# Patient Record
Sex: Female | Born: 1960 | Race: Black or African American | Hispanic: No | Marital: Single | Smoking: Current every day smoker
Health system: Southern US, Community
[De-identification: ages and names within clinical notes are randomized; demographics above are authoritative.]

## PROBLEM LIST (undated history)

## (undated) DIAGNOSIS — M1990 Unspecified osteoarthritis, unspecified site: Secondary | ICD-10-CM

## (undated) DIAGNOSIS — M359 Systemic involvement of connective tissue, unspecified: Secondary | ICD-10-CM

## (undated) DIAGNOSIS — E8809 Other disorders of plasma-protein metabolism, not elsewhere classified: Secondary | ICD-10-CM

## (undated) DIAGNOSIS — E538 Deficiency of other specified B group vitamins: Secondary | ICD-10-CM

## (undated) DIAGNOSIS — M549 Dorsalgia, unspecified: Secondary | ICD-10-CM

## (undated) DIAGNOSIS — M797 Fibromyalgia: Secondary | ICD-10-CM

## (undated) DIAGNOSIS — M069 Rheumatoid arthritis, unspecified: Secondary | ICD-10-CM

## (undated) HISTORY — PX: ABDOMINAL HYSTERECTOMY: SHX81

---

## 2000-05-13 ENCOUNTER — Emergency Department (HOSPITAL_COMMUNITY): Admission: EM | Admit: 2000-05-13 | Discharge: 2000-05-13 | Payer: Self-pay | Admitting: Emergency Medicine

## 2000-05-14 ENCOUNTER — Emergency Department (HOSPITAL_COMMUNITY): Admission: EM | Admit: 2000-05-14 | Discharge: 2000-05-14 | Payer: Self-pay | Admitting: Emergency Medicine

## 2001-03-22 ENCOUNTER — Encounter: Payer: Self-pay | Admitting: Family Medicine

## 2001-03-22 ENCOUNTER — Ambulatory Visit (HOSPITAL_COMMUNITY): Admission: RE | Admit: 2001-03-22 | Discharge: 2001-03-22 | Payer: Self-pay | Admitting: Family Medicine

## 2001-12-06 ENCOUNTER — Encounter: Payer: Self-pay | Admitting: Internal Medicine

## 2001-12-06 ENCOUNTER — Ambulatory Visit (HOSPITAL_COMMUNITY): Admission: RE | Admit: 2001-12-06 | Discharge: 2001-12-06 | Payer: Self-pay | Admitting: Internal Medicine

## 2004-03-15 ENCOUNTER — Ambulatory Visit (HOSPITAL_COMMUNITY): Admission: RE | Admit: 2004-03-15 | Discharge: 2004-03-15 | Payer: Self-pay | Admitting: Family Medicine

## 2010-07-30 ENCOUNTER — Encounter: Payer: Self-pay | Admitting: Family Medicine

## 2011-09-21 ENCOUNTER — Encounter (HOSPITAL_COMMUNITY): Payer: Self-pay

## 2011-09-21 ENCOUNTER — Inpatient Hospital Stay (HOSPITAL_COMMUNITY)
Admission: EM | Admit: 2011-09-21 | Discharge: 2011-09-24 | DRG: 916 | Disposition: A | Discharge status: Home / Self Care | Payer: Medicare Other | Attending: Internal Medicine | Admitting: Internal Medicine

## 2011-09-21 DIAGNOSIS — E162 Hypoglycemia, unspecified: Secondary | ICD-10-CM | POA: Diagnosis present

## 2011-09-21 DIAGNOSIS — M549 Dorsalgia, unspecified: Secondary | ICD-10-CM | POA: Diagnosis present

## 2011-09-21 DIAGNOSIS — X58XXXA Exposure to other specified factors, initial encounter: Secondary | ICD-10-CM | POA: Diagnosis present

## 2011-09-21 DIAGNOSIS — D649 Anemia, unspecified: Secondary | ICD-10-CM | POA: Diagnosis present

## 2011-09-21 DIAGNOSIS — M545 Low back pain, unspecified: Secondary | ICD-10-CM | POA: Diagnosis present

## 2011-09-21 DIAGNOSIS — E8809 Other disorders of plasma-protein metabolism, not elsewhere classified: Secondary | ICD-10-CM | POA: Diagnosis present

## 2011-09-21 DIAGNOSIS — D539 Nutritional anemia, unspecified: Secondary | ICD-10-CM | POA: Diagnosis present

## 2011-09-21 DIAGNOSIS — T7840XA Allergy, unspecified, initial encounter: Secondary | ICD-10-CM | POA: Diagnosis present

## 2011-09-21 DIAGNOSIS — G8929 Other chronic pain: Secondary | ICD-10-CM | POA: Diagnosis present

## 2011-09-21 DIAGNOSIS — F17200 Nicotine dependence, unspecified, uncomplicated: Secondary | ICD-10-CM | POA: Diagnosis present

## 2011-09-21 DIAGNOSIS — T783XXA Angioneurotic edema, initial encounter: Principal | ICD-10-CM | POA: Diagnosis present

## 2011-09-21 DIAGNOSIS — M810 Age-related osteoporosis without current pathological fracture: Secondary | ICD-10-CM | POA: Diagnosis present

## 2011-09-21 DIAGNOSIS — M069 Rheumatoid arthritis, unspecified: Secondary | ICD-10-CM | POA: Diagnosis present

## 2011-09-21 DIAGNOSIS — E876 Hypokalemia: Secondary | ICD-10-CM | POA: Diagnosis present

## 2011-09-21 DIAGNOSIS — E538 Deficiency of other specified B group vitamins: Secondary | ICD-10-CM | POA: Diagnosis present

## 2011-09-21 HISTORY — DX: Unspecified osteoarthritis, unspecified site: M19.90

## 2011-09-21 HISTORY — DX: Dorsalgia, unspecified: M54.9

## 2011-09-21 HISTORY — DX: Rheumatoid arthritis, unspecified: M06.9

## 2011-09-21 HISTORY — DX: Deficiency of other specified B group vitamins: E53.8

## 2011-09-21 HISTORY — DX: Other disorders of plasma-protein metabolism, not elsewhere classified: E88.09

## 2011-09-21 HISTORY — DX: Fibromyalgia: M79.7

## 2011-09-21 LAB — POCT I-STAT, CHEM 8
Calcium, Ion: 1.16 mmol/L (ref 1.12–1.32)
Creatinine, Ser: 0.8 mg/dL (ref 0.50–1.10)
Sodium: 145 mEq/L (ref 135–145)

## 2011-09-21 MED ORDER — ACETAMINOPHEN 500 MG PO TABS
1000.0000 mg | ORAL_TABLET | Freq: Once | ORAL | Status: AC
  Administered 2011-09-21: 1000 mg via ORAL
  Filled 2011-09-21: qty 2

## 2011-09-21 MED ORDER — POTASSIUM CHLORIDE CRYS ER 20 MEQ PO TBCR
60.0000 meq | EXTENDED_RELEASE_TABLET | Freq: Once | ORAL | Status: AC
  Administered 2011-09-22: 60 meq via ORAL
  Filled 2011-09-21: qty 3

## 2011-09-21 MED ORDER — ONDANSETRON HCL 4 MG/2ML IJ SOLN
4.0000 mg | INTRAMUSCULAR | Status: AC | PRN
  Administered 2011-09-21: 4 mg via INTRAVENOUS

## 2011-09-21 MED ORDER — DIPHENHYDRAMINE HCL 50 MG/ML IJ SOLN
50.0000 mg | Freq: Once | INTRAMUSCULAR | Status: AC
  Administered 2011-09-21: 50 mg via INTRAVENOUS
  Filled 2011-09-21: qty 1

## 2011-09-21 MED ORDER — ONDANSETRON HCL 4 MG/2ML IJ SOLN
INTRAMUSCULAR | Status: AC
  Filled 2011-09-21: qty 2

## 2011-09-21 MED ORDER — PREDNISONE 20 MG PO TABS
60.0000 mg | ORAL_TABLET | Freq: Once | ORAL | Status: AC
  Administered 2011-09-21: 60 mg via ORAL
  Filled 2011-09-21: qty 3

## 2011-09-21 NOTE — ED Provider Notes (Signed)
History     CSN: [id number]  Arrival date & time 09/21/11  1744   First MD Initiated Contact with Patient 09/21/11 1903      Chief Complaint  Patient presents with  . Allergic Reaction    HPI Pt was seen at 1900.  Per pt and family, c/o gradual onset and persistence of waxing and waning diffuse hives and "swelling all over" since last week.  Pt states her symptoms began shortly after she received an IM injection of solumedrol when she was at a local ED for c/o acute flair of her chronic back pain.  Pt has been eval by 3 different ED's since last week for the same symptoms since then.  States she has been treated with prednisone, atarax, benadryl, zantac with intermittent improvement "but then everything just comes back again."  Pt states she came to this ED because "it's still happening" and she "wants it fixed."  Denies dysphagia, no hoarse voice, no drooling/stridor, no wheezing/SOB, no CP, no abd pain, no N/V/D.       Past Medical History  Diagnosis Date  . Back pain   . Fibromyalgia   . Osteoporosis   . Rheumatoid arthritis   . DJD (degenerative joint disease)     Past Surgical History  Procedure Date  . Abdominal hysterectomy     History  Substance Use Topics  . Smoking status: Current Everyday Smoker    Types: Cigarettes  . Smokeless tobacco: Not on file  . Alcohol Use: No    Review of Systems ROS: Statement: All systems negative except as marked or noted in the HPI; Constitutional: Negative for fever and chills. ; ; Eyes: Negative for eye pain, redness and discharge. ; ; ENMT: Negative for ear pain, hoarseness, nasal congestion, sinus pressure and sore throat. ; ; Cardiovascular: Negative for chest pain, palpitations, diaphoresis, dyspnea and peripheral edema. ; ; Respiratory: Negative for cough, wheezing and stridor. ; ; Gastrointestinal: Negative for nausea, vomiting, diarrhea, abdominal pain, blood in stool, hematemesis, jaundice and rectal bleeding. . ; ;  Genitourinary: Negative for dysuria, flank pain and hematuria. ; ; Musculoskeletal: Negative for back pain and neck pain. Negative for swelling and trauma.; ; Skin: +hives.  Negative for abrasions, blisters, bruising and skin lesion.; ; Neuro: Negative for headache, lightheadedness and neck stiffness. Negative for weakness, altered level of consciousness , altered mental status, extremity weakness, paresthesias, involuntary movement, seizure and syncope.    Allergies  Review of patient's allergies indicates not on file.  Home Medications  No current outpatient prescriptions on file.  BP 124/82  Pulse 110  Temp(Src) 99 F (37.2 C) (Oral)  Resp 18  Ht 5\' 5"  (1.651 m)  Wt 150 lb (68.04 kg)  BMI 24.96 kg/m2  SpO2 99%  Physical Exam 1905: Physical examination:  Nursing notes reviewed; Vital signs and O2 SAT reviewed;  Constitutional: Well developed, Well nourished, Well hydrated, In no acute distress; Head:  Normocephalic, atraumatic; Eyes: EOMI, PERRL, No scleral icterus; ENMT: Mouth and pharynx normal, Mucous membranes moist, no intra-oral edema or erythema, no hoarse voice, no drooling, no stridor; Neck: Supple, Full range of motion, No lymphadenopathy; Cardiovascular: Regular rate and rhythm, No murmur, rub, or gallop; Respiratory: Breath sounds clear & equal bilaterally, No rales, rhonchi, wheezes, Normal respiratory effort/excursion, speaking full sentences with ease.; Chest: Nontender, Movement normal; Abdomen: Soft, Nontender, Nondistended, Normal bowel sounds; Extremities: Pulses normal, No tenderness, No edema, No calf edema or asymmetry.; Neuro: AA&Ox3, Major CN grossly intact. Speech clear.  No gross focal motor or sensory deficits in extremities.; Skin: Color normal, Warm, Dry, +diffuse hives to neck, upper back/chest and arms.  +bilateral periorbital and left lower face edema.      ED Course  Procedures   1905:  Pt is yelling in a loud clear voice to ED staff saying she "wants it  completely better right now!"  Long d/w pt and family regarding allergic reaction.  Family verb understanding, pt continues to state she "doesn't care and wants it fixed now!"    9:13 PM:  Pt states she "doesn't feel any better" after prednisone and benadryl.  States her "throat feels like it's closing now."  No intra-oral edema, no hoarse voice, no drooling or stridor.  Speech clear, lungs continue CTA bilat with Sats 97% R/A.  Dx testing d/w pt and family.  Questions answered.  Verb understanding, agreeable to possible observation admit.  T/C to [facility] Dr. Rito Ehrlich, case discussed, including:  HPI, pertinent PM/SHx, VS/PE, dx testing, ED course and treatment:  Agreeable to come to ED to eval pt and speak with pt/family regarding possible observation admit.    MDM  MDM Reviewed: nursing note and vitals   Results for orders placed during the hospital encounter of 09/21/11  POCT I-STAT, CHEM 8      Component Value Range   Sodium 145  135 - 145 (mEq/L)   Potassium 3.1 (*) 3.5 - 5.1 (mEq/L)   Chloride 106  96 - 112 (mEq/L)   BUN 17  6 - 23 (mg/dL)   Creatinine, Ser 1.61  0.50 - 1.10 (mg/dL)   Glucose, Bld 69 (*) 70 - 99 (mg/dL)   Calcium, Ion 0.96  0.45 - 1.32 (mmol/L)   TCO2 28  0 - 100 (mmol/L)   Hemoglobin 11.2 (*) 12.0 - 15.0 (g/dL)   HCT 40.9 (*) 81.1 - 46.0 (%)              Laray Anger, DO 09/23/11 1445

## 2011-09-21 NOTE — ED Notes (Signed)
Went to another local er 2 weeks ago for back pain, was given solumedrol, since then face cont.to swell.  Was seen at a different er today and given several meds, swelling went down, now swollen again.  Unsure of cause, no difficulty breathing. Face and eyes swollen

## 2011-09-21 NOTE — ED Notes (Signed)
Report received from Judy, RN.

## 2011-09-21 NOTE — ED Notes (Signed)
Hospitalist assessing patient now.

## 2011-09-21 NOTE — ED Notes (Signed)
Patient started vomiting shortly after reassessing her. Patient states that she feels like her throat is closing up and is swelling more and she is having more difficulty breathing. Notified Dr. Richrd Prime. Patient complaining that a doctor needs to look at her throat.

## 2011-09-21 NOTE — H&P (Signed)
Melissa Berg is an 51 y.o. female.    PCP: Does not have one  Chief Complaint: Swelling up of throat and skin rash  HPI: This is a 51 year old, African American female, who is a history of chronic back pain, osteoporosis, Rheumatoid arthritis. However, she does not take any medications on a regular basis. Patient was in her usual state of health till March 4, when she went to [facility] for back pain. According to the patient and her daughter Solu-Medrol was injected into her back or the hip. About 10-15 minutes after she got this injection patient's tongue started swelling up and her throat started swelling up. She was taken back to the hospital, where she was given Benadryl and ranitidine and she improved. However, her hives on the skin did not improve. The hives were present all over the body. She subsequently went to [facility] and she was prescribed prednisone and Benadryl and was discharged home with the prednisone. The patient's symptoms did improve. She was prescribed prednisone for about 5-10 days. However, subsequently, the swelling returned after the prednisone was stopped. She went to [facility] in [place] today and was prescribed Vistaril, ranitidine, and Benadryl and was sent home. The rash and swelling got worse, and, so, she decided to come here. She's been given prednisone in the ED, and she's feeling somewhat better. However, her rash and her swelling still persist. She does have severe itching. She denies taking any other medications at this time. She denies using any new products on her body recently. Denies eating any new foods recently. She's blaming the Solu-Medrol for her allergic reaction.   Home Medications: Prior to Admission medications   Medication Sig Start Date End Date Taking? Authorizing Provider  ranitidine (ZANTAC) 150 MG tablet Take 150 mg by mouth 2 (two) times daily.   Yes Historical Provider, MD  predniSONE (DELTASONE) 20 MG tablet  Take 60 mg by mouth daily. For 5 days 09/15/11   Historical Provider, MD    Allergies:  Allergies  Allergen Reactions  . Other Hives, Itching and Swelling    Solumedrol REACTION: Severe facial, tongue, and throat swelling    Past Medical History: Past Medical History  Diagnosis Date  . Back pain   . Fibromyalgia   . Osteoporosis   . Rheumatoid arthritis   . DJD (degenerative joint disease)     Past Surgical History  Procedure Date  . Abdominal hysterectomy     Social History:  reports that she has been smoking Cigarettes.  She has been smoking about .25 packs per day. She does not have any smokeless tobacco history on file. She reports that she does not drink alcohol or use illicit drugs.  Family History: Diabetes, and hypertension runs in the family.  Review of Systems - History obtained from the patient General ROS: negative Psychological ROS: negative Ophthalmic ROS: negative ENT ROS: negative Allergy and Immunology ROS: positive for - hives Hematological and Lymphatic ROS: negative Endocrine ROS: negative Respiratory ROS: no cough, shortness of breath, or wheezing Cardiovascular ROS: no chest pain or dyspnea on exertion Gastrointestinal ROS: no abdominal pain, change in bowel habits, or black or bloody stools Genito-Urinary ROS: no dysuria, trouble voiding, or hematuria Musculoskeletal ROS: negative Neurological ROS: negative Dermatological ROS: negative  Physical Examination Blood pressure 114/98, pulse 106, temperature 98.8 F (37.1 C), temperature source Oral, resp. rate 18, height 5\' 5"  (1.651 m), weight 68.04 kg (150 lb), SpO2 95.00%.  General appearance: alert, cooperative, appears stated  age and no distress Head: Her face is swollen. Head is otherwise, normocephalic and atraumatic. Eyes: conjunctivae/corneas clear. PERRL, EOM's intact.  Throat: Her lips are slightly swollen. Tongue appears to be normal size. No stridor is present. Neck: no adenopathy, no  carotid bruit, no JVD, supple, symmetrical, trachea midline and thyroid not enlarged, symmetric, no tenderness/mass/nodules Back: symmetric, no curvature. ROM normal. No CVA tenderness. Resp: clear to auscultation bilaterally. No wheezing is heard. Cardio: regular rate and rhythm, S1, S2 normal, no murmur, click, rub or gallop GI: soft, non-tender; bowel sounds normal; no masses,  no organomegaly Extremities: extremities normal, atraumatic, no cyanosis or edema Pulses: 2+ and symmetric Skin: She urticaria all over her body, more pronounced in the neck area and the upper back. She has urticaria in the legs as well. Lymph nodes: Cervical, supraclavicular, and axillary nodes normal. Neurologic: Grossly normal  Laboratory Data: Results for orders placed during the hospital encounter of 09/21/11 (from the past 48 hour(s))  POCT I-STAT, CHEM 8     Status: Abnormal   Collection Time   09/21/11  8:50 PM      Component Value Range Comment   Sodium 145  135 - 145 (mEq/L)    Potassium 3.1 (*) 3.5 - 5.1 (mEq/L)    Chloride 106  96 - 112 (mEq/L)    BUN 17  6 - 23 (mg/dL)    Creatinine, Ser 1.61  0.50 - 1.10 (mg/dL)    Glucose, Bld 69 (*) 70 - 99 (mg/dL)    Calcium, Ion 0.96  1.12 - 1.32 (mmol/L)    TCO2 28  0 - 100 (mmol/L)    Hemoglobin 11.2 (*) 12.0 - 15.0 (g/dL)    HCT 04.5 (*) 40.9 - 46.0 (%)     Radiology Reports: No results found.  Assessment/Plan  Principal Problem:  *Allergic angioedema Active Problems:  Allergic reaction  Chronic back pain  Hypokalemia  Hypoglycemia  Anemia   #1 allergic angioedema with the allergic reaction: Etiology for this remains unclear. The patient attributes this to Solu-Medrol. However, she did show improvement, when she was put on prednisone while she was at [facility]. Because patient still has significant swelling of her face and has severe skin reaction we will observe the patient in step down unit. We'll give her prednisone, Benadryl, H2 antagonist.  Since the inciting agent is not known patient may have to be referred to an allergist as an outpatient. I am hoping with the above-mentioned treatment her symptoms will improve.  #2 hypokalemia. Will be repleted orally and intravenously. Magnesium will be checked.  #3 hypoglycemia: We will recheck her glucose levels.  #4 anemia: check anemia panel.  She has chronic back pain, osteoporosis, rheumatoid arthritis. All these issues appear to be stable.  DVT, prophylaxis with SCDs.  Further management decisions will depend on results of further testing and patient's response to treatment  [facility] Pager [phone]  09/22/2011, 12:00 AM

## 2011-09-21 NOTE — ED Notes (Signed)
Patient ambulated to bathroom.

## 2011-09-21 NOTE — ED Notes (Signed)
Assessed patient's throat, no visible airway swelling/airway obstruction noted. Throat appears swollen, reddened.

## 2011-09-22 ENCOUNTER — Encounter (HOSPITAL_COMMUNITY): Payer: Self-pay | Admitting: Intensive Care

## 2011-09-22 LAB — GLUCOSE, CAPILLARY: Glucose-Capillary: 135 mg/dL — ABNORMAL HIGH (ref 70–99)

## 2011-09-22 LAB — COMPREHENSIVE METABOLIC PANEL
Albumin: 2.8 g/dL — ABNORMAL LOW (ref 3.5–5.2)
BUN: 10 mg/dL (ref 6–23)
Calcium: 8.7 mg/dL (ref 8.4–10.5)
Chloride: 104 mEq/L (ref 96–112)
Creatinine, Ser: 0.67 mg/dL (ref 0.50–1.10)
Total Bilirubin: 0.9 mg/dL (ref 0.3–1.2)
Total Protein: 5.5 g/dL — ABNORMAL LOW (ref 6.0–8.3)

## 2011-09-22 LAB — URINALYSIS, ROUTINE W REFLEX MICROSCOPIC
Bilirubin Urine: NEGATIVE
Ketones, ur: NEGATIVE mg/dL
Leukocytes, UA: NEGATIVE
Nitrite: NEGATIVE
Protein, ur: NEGATIVE mg/dL
Urobilinogen, UA: 0.2 mg/dL (ref 0.0–1.0)

## 2011-09-22 LAB — CBC
Hemoglobin: 12.3 g/dL (ref 12.0–15.0)
MCV: 95.1 fL (ref 78.0–100.0)
Platelets: 156 10*3/uL (ref 150–400)
RBC: 3.84 MIL/uL — ABNORMAL LOW (ref 3.87–5.11)
WBC: 14.9 10*3/uL — ABNORMAL HIGH (ref 4.0–10.5)

## 2011-09-22 LAB — IRON AND TIBC
Iron: 17 ug/dL — ABNORMAL LOW (ref 42–135)
Saturation Ratios: 6 % — ABNORMAL LOW (ref 20–55)
TIBC: 264 ug/dL (ref 250–470)

## 2011-09-22 LAB — FERRITIN: Ferritin: 147 ng/mL (ref 10–291)

## 2011-09-22 LAB — MRSA PCR SCREENING: MRSA by PCR: NEGATIVE

## 2011-09-22 LAB — MAGNESIUM: Magnesium: 2.1 mg/dL (ref 1.5–2.5)

## 2011-09-22 MED ORDER — DIPHENHYDRAMINE HCL 25 MG PO CAPS
50.0000 mg | ORAL_CAPSULE | Freq: Four times a day (QID) | ORAL | Status: DC
  Administered 2011-09-22 – 2011-09-24 (×8): 50 mg via ORAL
  Filled 2011-09-22 (×8): qty 2

## 2011-09-22 MED ORDER — ONDANSETRON HCL 4 MG/2ML IJ SOLN: 4.0000 mg | Freq: Four times a day (QID) | INTRAMUSCULAR | Status: DC | PRN

## 2011-09-22 MED ORDER — ALBUTEROL SULFATE (5 MG/ML) 0.5% IN NEBU
2.5000 mg | INHALATION_SOLUTION | RESPIRATORY_TRACT | Status: DC | PRN
  Administered 2011-09-22 – 2011-09-23 (×3): 2.5 mg via RESPIRATORY_TRACT
  Filled 2011-09-22 (×3): qty 0.5

## 2011-09-22 MED ORDER — DIPHENHYDRAMINE HCL 50 MG/ML IJ SOLN: 25.0000 mg | Freq: Four times a day (QID) | INTRAMUSCULAR | Status: DC | PRN

## 2011-09-22 MED ORDER — ACETAMINOPHEN 325 MG PO TABS
650.0000 mg | ORAL_TABLET | Freq: Four times a day (QID) | ORAL | Status: DC | PRN
  Administered 2011-09-22: 650 mg via ORAL
  Filled 2011-09-22: qty 2

## 2011-09-22 MED ORDER — DIPHENHYDRAMINE HCL 50 MG/ML IJ SOLN: 50.0000 mg | Freq: Once | INTRAMUSCULAR | Status: DC

## 2011-09-22 MED ORDER — DIPHENHYDRAMINE HCL 50 MG/ML IJ SOLN
INTRAMUSCULAR | Status: AC
  Administered 2011-09-22: 12:00:00
  Filled 2011-09-22: qty 1

## 2011-09-22 MED ORDER — PREDNISONE 20 MG PO TABS
60.0000 mg | ORAL_TABLET | Freq: Two times a day (BID) | ORAL | Status: DC
  Administered 2011-09-22: 60 mg via ORAL
  Filled 2011-09-22: qty 3

## 2011-09-22 MED ORDER — SODIUM CHLORIDE 0.9 % IJ SOLN
3.0000 mL | Freq: Two times a day (BID) | INTRAMUSCULAR | Status: DC
  Administered 2011-09-22 – 2011-09-23 (×4): 3 mL via INTRAVENOUS
  Filled 2011-09-22 (×3): qty 3

## 2011-09-22 MED ORDER — KCL IN DEXTROSE-NACL 20-5-0.45 MEQ/L-%-% IV SOLN
INTRAVENOUS | Status: DC
  Administered 2011-09-22: 100 mL via INTRAVENOUS

## 2011-09-22 MED ORDER — ACETAMINOPHEN 650 MG RE SUPP: 650.0000 mg | Freq: Four times a day (QID) | RECTAL | Status: DC | PRN

## 2011-09-22 MED ORDER — LORAZEPAM 0.5 MG PO TABS
0.5000 mg | ORAL_TABLET | Freq: Four times a day (QID) | ORAL | Status: DC | PRN
  Filled 2011-09-22: qty 1

## 2011-09-22 MED ORDER — PREDNISONE 20 MG PO TABS
60.0000 mg | ORAL_TABLET | Freq: Every day | ORAL | Status: DC
  Administered 2011-09-23 – 2011-09-24 (×2): 60 mg via ORAL
  Filled 2011-09-22 (×3): qty 3

## 2011-09-22 MED ORDER — FAMOTIDINE 20 MG PO TABS
20.0000 mg | ORAL_TABLET | Freq: Two times a day (BID) | ORAL | Status: DC
  Administered 2011-09-22 – 2011-09-24 (×6): 20 mg via ORAL
  Filled 2011-09-22 (×6): qty 1

## 2011-09-22 MED ORDER — ONDANSETRON HCL 4 MG PO TABS: 4.0000 mg | ORAL_TABLET | Freq: Four times a day (QID) | ORAL | Status: DC | PRN

## 2011-09-22 NOTE — Progress Notes (Signed)
Subjective: Itching and hives have returned. Irritated that her condition has "gone on for 13 days!" Can't sleep. Complains of sore throat and cough. Has seen a rheumatologist in [place] a few weeks ago.  Objective: Vital signs in last 24 hours: Filed Vitals:   09/22/11 0500 09/22/11 0600 09/22/11 0700 09/22/11 0947  BP: 107/66 91/66 102/53   Pulse: 68 71 78   Temp:   98.2 F (36.8 C)   TempSrc:   Oral   Resp: 16 15 28    Height:      Weight:      SpO2: 94% 94% 94% 98%   Weight change:   Intake/Output Summary (Last 24 hours) at 09/22/11 1036 Last data filed at 09/22/11 0800  Gross per 24 hour  Intake    360 ml  Output    825 ml  Net   -465 ml   Physical Exam: General: Yelling and agitated. No respiratory distress HEENT facial swelling, periorbital, lips. No airway compromise. Tongue is not swollen. Lungs clear to auscultation bilaterally without wheeze rhonchi or rales Cardiovascular regular rate rhythm without murmurs gallops rubs Abdomen soft nontender nondistended Extremities no clubbing cyanosis or edema skin diffuse urticaria  Lab Results: Basic Metabolic Panel:  Lab 09/22/11 2956 09/21/11 2050  NA 135 145  K 4.3 3.1*  CL 104 106  CO2 27 --  GLUCOSE 111* 69*  BUN 10 17  CREATININE 0.67 0.80  CALCIUM 8.7 --  MG 2.1 --  PHOS -- --   Liver Function Tests:  Lab 09/22/11 0511  AST 17  ALT 17  ALKPHOS 56  BILITOT 0.9  PROT 5.5*  ALBUMIN 2.8*   No results found for this basename: LIPASE:2,AMYLASE:2 in the last 168 hours No results found for this basename: AMMONIA:2 in the last 168 hours CBC:  Lab 09/22/11 0511 09/21/11 2050  WBC 14.9* --  NEUTROABS -- --  HGB 12.3 11.2*  HCT 36.5 33.0*  MCV 95.1 --  PLT 156 --   Cardiac Enzymes: No results found for this basename: CKTOTAL:3,CKMB:3,CKMBINDEX:3,TROPONINI:3 in the last 168 hours BNP: No results found for this basename: PROBNP:3 in the last 168 hours D-Dimer: No results found for this basename:  DDIMER:2 in the last 168 hours CBG:  Lab 09/22/11 0009  GLUCAP 135*   Hemoglobin A1C: No results found for this basename: HGBA1C in the last 168 hours Fasting Lipid Panel: No results found for this basename: CHOL,HDL,LDLCALC,TRIG,CHOLHDL,LDLDIRECT in the last 213 hours Thyroid Function Tests: No results found for this basename: TSH,T4TOTAL,FREET4,T3FREE,THYROIDAB in the last 168 hours Coagulation: No results found for this basename: LABPROT:4,INR:4 in the last 168 hours Anemia Panel:  Lab 09/22/11 0511  VITAMINB12 --  FOLATE --  FERRITIN --  TIBC --  IRON --  RETICCTPCT 3.8*   Urine Drug Screen: Drugs of Abuse  No results found for this basename: labopia, cocainscrnur, labbenz, amphetmu, thcu, labbarb    Alcohol Level: No results found for this basename: ETH:2 in the last 168 hours Urinalysis:  Lab 09/22/11 0114  COLORURINE YELLOW  LABSPEC 1.020  PHURINE 7.0  GLUCOSEU NEGATIVE  HGBUR NEGATIVE  BILIRUBINUR NEGATIVE  KETONESUR NEGATIVE  PROTEINUR NEGATIVE  UROBILINOGEN 0.2  NITRITE NEGATIVE  LEUKOCYTESUR NEGATIVE   Micro Results: Recent Results (from the past 240 hour(s))  MRSA PCR SCREENING     Status: Normal   Collection Time   09/22/11  1:45 AM      Component Value Range Status Comment   MRSA by PCR NEGATIVE  NEGATIVE  Final     Scheduled Meds:   . acetaminophen  1,000 mg Oral Once  . diphenhydrAMINE      . diphenhydrAMINE  50 mg Oral Q6H  . diphenhydrAMINE  50 mg Intravenous Once  . diphenhydrAMINE  50 mg Intravenous Once  . famotidine  20 mg Oral BID  . potassium chloride  60 mEq Oral Once  . predniSONE  60 mg Oral Once  . predniSONE  60 mg Oral Q breakfast  . sodium chloride  3 mL Intravenous Q12H  . DISCONTD: predniSONE  60 mg Oral BID WC   Continuous Infusions:   . dextrose 5 % and 0.45 % NaCl with KCl 20 mEq/L 100 mL/hr at 09/22/11 0600   PRN Meds:.acetaminophen, albuterol, ondansetron, ondansetron (ZOFRAN) IV, ondansetron, DISCONTD:  acetaminophen, DISCONTD: diphenhydrAMINE Assessment/Plan: Principal Problem:  *Allergic angioedema Active Problems:  Allergic reaction  Chronic back pain  Hypokalemia, resolved  Hypoglycemia, resolved  Anemia Continue steroids. Change Benadryl to every 6 scheduled. Transferred to the floor. No evidence of airway compromise. Outpatient referral to allergy and immunology for further testing. Benzodiazepines as needed for agitation.   LOS: 1 day   Melissa Berg L 09/22/2011, 10:36 AM

## 2011-09-22 NOTE — ED Notes (Signed)
Attempted to call report on patient to icu, greg stated that a nurse from the floor had to come down before they could take this patient.

## 2011-09-23 MED ORDER — HYDROXYZINE HCL 25 MG PO TABS
50.0000 mg | ORAL_TABLET | Freq: Once | ORAL | Status: AC
  Administered 2011-09-23: 50 mg via ORAL
  Filled 2011-09-23: qty 2

## 2011-09-23 NOTE — Progress Notes (Signed)
PERIORBITAL EDEMA IS GREATLY IMPROVED SINCE ADMISSION FROM ER.PT STATES THAT HIVES AND ITCHING ESCALATE SHORTLY AFTER TAKING HER AM PREDNISONE RELIEF FROM ITCHING WHEN WIPES WITH CHG CLOTHS.

## 2011-09-23 NOTE — Progress Notes (Signed)
Subjective: Slept on and off last night. Rash got better but is starting to come back this morning.  Objective: Vital signs in last 24 hours: Filed Vitals:   09/22/11 2100 09/22/11 2344 09/23/11 0100 09/23/11 0426  BP: 118/61  114/65 120/81  Pulse: 67  68 58  Temp: 98.1 F (36.7 C) 98.1 F (36.7 C)  98.1 F (36.7 C)  TempSrc: Oral Oral  Oral  Resp: 19  13 14   Height:      Weight:      SpO2: 96%  97% 97%   Weight change:   Intake/Output Summary (Last 24 hours) at 09/23/11 1022 Last data filed at 09/23/11 0900  Gross per 24 hour  Intake   1320 ml  Output    775 ml  Net    545 ml   Physical Exam: General: Calmer today, but still periodically agitated HEENT still some periorbital edema, but less markeded. Somewhat violaceous around her eyes. Lips less swollen. Lungs clear to auscultation bilaterally without wheeze rhonchi or rales Cardiovascular regular rate rhythm without murmurs gallops rubs Abdomen soft nontender nondistended Extremities no clubbing cyanosis or edema skin diffuse urticaria. No Gottron's papules noted Skin: Urticaria less marked, but still some erythema. Multiple excoriation marks.  Lab Results: Basic Metabolic Panel:  Lab 09/22/11 2956 09/21/11 2050  NA 135 145  K 4.3 3.1*  CL 104 106  CO2 27 --  GLUCOSE 111* 69*  BUN 10 17  CREATININE 0.67 0.80  CALCIUM 8.7 --  MG 2.1 --  PHOS -- --   Liver Function Tests:  Lab 09/22/11 0511  AST 17  ALT 17  ALKPHOS 56  BILITOT 0.9  PROT 5.5*  ALBUMIN 2.8*   No results found for this basename: LIPASE:2,AMYLASE:2 in the last 168 hours No results found for this basename: AMMONIA:2 in the last 168 hours CBC:  Lab 09/22/11 0511 09/21/11 2050  WBC 14.9* --  NEUTROABS -- --  HGB 12.3 11.2*  HCT 36.5 33.0*  MCV 95.1 --  PLT 156 --   Cardiac Enzymes: No results found for this basename: CKTOTAL:3,CKMB:3,CKMBINDEX:3,TROPONINI:3 in the last 168 hours BNP: No results found for this basename: PROBNP:3  in the last 168 hours D-Dimer: No results found for this basename: DDIMER:2 in the last 168 hours CBG:  Lab 09/22/11 0009  GLUCAP 135*   Hemoglobin A1C: No results found for this basename: HGBA1C in the last 168 hours Fasting Lipid Panel: No results found for this basename: CHOL,HDL,LDLCALC,TRIG,CHOLHDL,LDLDIRECT in the last 213 hours Thyroid Function Tests: No results found for this basename: TSH,T4TOTAL,FREET4,T3FREE,THYROIDAB in the last 168 hours Coagulation: No results found for this basename: LABPROT:4,INR:4 in the last 168 hours Anemia Panel:  Lab 09/22/11 0511  VITAMINB12 179*  FOLATE 9.2  FERRITIN 147  TIBC 264  IRON 17*  RETICCTPCT 3.8*   Urine Drug Screen: Drugs of Abuse  No results found for this basename: labopia,  cocainscrnur,  labbenz,  amphetmu,  thcu,  labbarb    Alcohol Level: No results found for this basename: ETH:2 in the last 168 hours Urinalysis:  Lab 09/22/11 0114  COLORURINE YELLOW  LABSPEC 1.020  PHURINE 7.0  GLUCOSEU NEGATIVE  HGBUR NEGATIVE  BILIRUBINUR NEGATIVE  KETONESUR NEGATIVE  PROTEINUR NEGATIVE  UROBILINOGEN 0.2  NITRITE NEGATIVE  LEUKOCYTESUR NEGATIVE   Micro Results: Recent Results (from the past 240 hour(s))  MRSA PCR SCREENING     Status: Normal   Collection Time   09/22/11  1:45 AM  Component Value Range Status Comment   MRSA by PCR NEGATIVE  NEGATIVE  Final     Scheduled Meds:    . diphenhydrAMINE      . diphenhydrAMINE  50 mg Oral Q6H  . diphenhydrAMINE  50 mg Intravenous Once  . famotidine  20 mg Oral BID  . hydrOXYzine  50 mg Oral Once  . predniSONE  60 mg Oral Q breakfast  . sodium chloride  3 mL Intravenous Q12H  . DISCONTD: predniSONE  60 mg Oral BID WC   Continuous Infusions:    . DISCONTD: dextrose 5 % and 0.45 % NaCl with KCl 20 mEq/L 100 mL/hr at 09/22/11 0600   PRN Meds:.acetaminophen, albuterol, LORazepam, ondansetron (ZOFRAN) IV, ondansetron, DISCONTD: acetaminophen, DISCONTD:  diphenhydrAMINE Assessment/Plan: Principal Problem:  *Allergic angioedema improved but continues. Will add Atarax as needed. Continue Benadryl, Pepcid and prednisone. Will need referral to allergist as an outpatient. Patient has a violaceous rash today, but no other signs or symptoms of dermatomyositis. May need to discuss with her rheumatologist in [place]. Could this be an atypical presentation of an autoimmune problem?  Chronic back pain  Hypokalemia, resolved  Hypoglycemia, resolved  Anemia    LOS: 2 days   Nettie Wyffels L 09/23/2011, 10:22 AM

## 2011-09-24 ENCOUNTER — Encounter (HOSPITAL_COMMUNITY): Payer: Self-pay | Admitting: Internal Medicine

## 2011-09-24 DIAGNOSIS — E538 Deficiency of other specified B group vitamins: Secondary | ICD-10-CM

## 2011-09-24 DIAGNOSIS — E8809 Other disorders of plasma-protein metabolism, not elsewhere classified: Secondary | ICD-10-CM

## 2011-09-24 HISTORY — DX: Deficiency of other specified B group vitamins: E53.8

## 2011-09-24 HISTORY — DX: Other disorders of plasma-protein metabolism, not elsewhere classified: E88.09

## 2011-09-24 MED ORDER — DIPHENHYDRAMINE HCL 50 MG PO CAPS: 50.0000 mg | ORAL_CAPSULE | Freq: Four times a day (QID) | ORAL | Status: DC

## 2011-09-24 MED ORDER — VITAMIN B12 100 MCG PO TABS: ORAL_TABLET | ORAL | Status: DC

## 2011-09-24 MED ORDER — FAMOTIDINE 20 MG PO TABS: 20.0000 mg | ORAL_TABLET | Freq: Two times a day (BID) | ORAL | Status: AC

## 2011-09-24 MED ORDER — CYANOCOBALAMIN 1000 MCG/ML IJ SOLN
1000.0000 ug | Freq: Every day | INTRAMUSCULAR | Status: DC
  Administered 2011-09-24: 1000 ug via INTRAMUSCULAR
  Filled 2011-09-24: qty 1

## 2011-09-24 MED ORDER — PREDNISONE 10 MG PO TABS: ORAL_TABLET | ORAL | Status: DC

## 2011-09-24 NOTE — Discharge Summary (Signed)
Physician Discharge Summary  Melissa Berg MRN: [id number] DOB/AGE: 01-04-1961 51 y.o.  PCP: [facility] [place]   Admit date: 09/21/2011 Discharge date: 09/24/2011  Discharge Diagnoses:  1. Allergic angioedema. Etiology not certain, but however patient believes the culprit was Solu-Medrol. 2. Newly diagnosed vitamin B 12 deficiency with macrocytic anemia. 3. Hypoglycemia, resolved. 4. Hypokalemia, resolved. 5. Hypoalbuminemia. 6. Chronic low back pain. 7. Rheumatoid arthritis.    Medication List  As of 09/24/2011  9:25 AM   STOP taking these medications         ranitidine 150 MG tablet         TAKE these medications         diphenhydrAMINE 50 MG capsule   Commonly known as: BENADRYL   Take 1 capsule (50 mg total) by mouth every 6 (six) hours. TAKE UNTIL YOUR SYMPTOMS COMPLETELY RESOLVE.      famotidine 20 MG tablet   Commonly known as: PEPCID   Take 1 tablet (20 mg total) by mouth 2 (two) times daily. TAKE UNTIL YOUR SYMPTOMS COMPLETELY RESOLVE.      predniSONE 10 MG tablet   Commonly known as: DELTASONE   TAKE 6 TABLETS DAILY FOR TWO DAYS; THEN 5 TABLETS DAILY FOR TWO DAYS; THEN 4 TABLETS THE NEXT DAY; THEN 3 TABLETS THE NEXT DAY; THEN 2 TABLETS THE DAY; THEN 1 TABLET THE NEXT DAY; THEN STOP.      vitamin B-12 100 MCG tablet   Commonly known as: CYANOCOBALAMIN   TAKE 1 TABLET OR CAPSULE DAILY FOR YOUR LOW VITAMIN B12 LEVEL.            Discharge Condition: Improved.  Disposition: Final discharge disposition not confirmed   Consults: None.     Microbiology: Recent Results (from the past 240 hour(s))  MRSA PCR SCREENING     Status: Normal   Collection Time   09/22/11  1:45 AM      Component Value Range Status Comment   MRSA by PCR NEGATIVE  NEGATIVE  Final      Labs: No results found for this or any previous visit (from the past 48 hour(s)).   HPI : The patient is a 51 year old woman with a past medical  history significant for chronic back pain and rheumatoid arthritis, who presented to the emergency department on September 21 2011 with a chief complaint of throat swelling and skin rash. She was in her usual state of health until September 10, 2011, when she went to [facility] for back pain. According to the patient and her daughter, Solu-Medrol was injected into her back or her hip. Approximately 10-15 minutes later, her tongue began to swell and her throat started swelling as well. She was taken back to the hospital where she was given Benadryl and ranitidine. She improved. However, the hives on her skin did not improve. Subsequently, she went to [facility]. There, she was prescribed Benadryl and prednisone and was discharged to home on a prednisone taper. Her symptoms improved. However, the swelling returned after the prednisone course was completed. She then went to [facility] in [place]. There, she was prescribed Vistaril, ranitidine, and Benadryl, and sent home. Her symptoms did not improve and therefore she presented to [facility].  HOSPITAL COURSE: The patient was started on prednisone, Benadryl, and Pepcid. She was repleted with potassium chloride as her potassium level was low. Because of her hypoglycemia, she was given dextrose. She was  noted to be anemic. The results of her anemia panel revealed a total iron of 17, TIBC of 264, percent saturation of 6, ferritin of 147, folate of 9.2, and vitamin B12 of 179. She was started on vitamin B12 supplementation. She was given one IM dose prior to discharge and prescribed oral vitamin B12 daily. The patient will need to have her vitamin B 12 level reassessed in 2-3 months. She may require ongoing IM vitamin B12 replacement therapy. This will be deferred to her primary care physician.  Over the course of the hospitalization, the patient's angioedema and rash subsided. She did still have mild residual  puffiness around her eyes, but no oral- pharyngeal edema and nosignificant  rash. She was resistant to continuing prednisone,  however,  I informed  her that she had gotten better on prednisone and bit  should be continued for at least one to 2 more weeks. She finally agreed. I also recommended that she followup with an allergist. I was prepared to make an appointment for her to see a local allergist, however, she had spoken with her primary care physician's office and they are  planning to make an appointment with an allergist of her choice. In the meantime, I made an appointment for her to followup with her rheumatologist Dr. Dierdre Forth tomorrow for further evaluation and to see if this recurrent angioedema could be secondary to a manifestation of her rheumatoid arthritis.  The patient was discharged to home in improved and stable condition. She was advised to continue prednisone to be tapered as prescribed, Pepcid, and Benadryl.    Discharge Exam:  Blood pressure 122/69, pulse 68, temperature 97.9 F (36.6 C), temperature source Oral, resp. rate 18, height 5\' 5"  (1.651 m), weight 69.8 kg (153 lb 14.1 oz), SpO2 98.00%. HEENT: Head is cephalic, nontraumatic. There is mild puffiness around both eyes and scant erythema. There is no evidence of oral pharyngeal edema, erythema, or tongue swelling. Lungs: Clear to auscultation bilaterally. Heart: S1, S2, with a soft systolic murmur. Abdomen: Positive bowel sounds, soft, nontender, nondistended. Extremities: No pedal edema. Skin: No whole body rash or urticarial lesions.    Discharge Orders    Future Orders Please Complete By Expires   Diet - low sodium heart healthy      Increase activity slowly      Discharge instructions      Comments:   FOLLOW UP WITH AN ALLERGIST OF YOUR CHOICE AS SOON AS POSSIBLE.      Follow-up Information    Please follow up. (FOLLOW UP WITH YOUR PRIMARY DOCTOR IN 2-5 DAYS.)       Follow up with Melissa Hail, MD on  09/25/2011. (AT 1:45 PM)    Contact information:   [address], [address] [facility] [place] [address] [phone]           Total discharge time: 35 minutes.  Signed: Daiveon Markman 09/24/2011, 9:25 AM

## 2011-09-24 NOTE — Progress Notes (Signed)
Discharge instructions given to patient and script for prednisone dose pack.  Pt verbalized understanding of discharge instructions.  No acute distress noted.  Pt will follow up with primary care physician and has an appointment with RA doctor tomorrow at 0145pm.  Pt wants to call [facility] for an allergist.  Encouraged patient to call and make an appointment asap to see allergist.  Patient verbalized feeling better and being ready to go home.  Waiting for daughter to get here for transfer home.

## 2011-09-24 NOTE — Progress Notes (Signed)
Utilization review completed.  

## 2013-01-06 ENCOUNTER — Encounter (HOSPITAL_COMMUNITY): Payer: Self-pay | Admitting: *Deleted

## 2013-01-06 ENCOUNTER — Emergency Department (HOSPITAL_COMMUNITY)
Admission: EM | Admit: 2013-01-06 | Discharge: 2013-01-06 | Discharge status: Against Medical Advice | Payer: Medicare Other | Attending: Emergency Medicine | Admitting: Emergency Medicine

## 2013-01-06 DIAGNOSIS — M940 Chondrocostal junction syndrome [Tietze]: Secondary | ICD-10-CM

## 2013-01-06 DIAGNOSIS — R1013 Epigastric pain: Secondary | ICD-10-CM | POA: Insufficient documentation

## 2013-01-06 DIAGNOSIS — Z862 Personal history of diseases of the blood and blood-forming organs and certain disorders involving the immune mechanism: Secondary | ICD-10-CM | POA: Insufficient documentation

## 2013-01-06 DIAGNOSIS — R079 Chest pain, unspecified: Secondary | ICD-10-CM | POA: Diagnosis present

## 2013-01-06 DIAGNOSIS — F17200 Nicotine dependence, unspecified, uncomplicated: Secondary | ICD-10-CM | POA: Diagnosis not present

## 2013-01-06 DIAGNOSIS — Z8739 Personal history of other diseases of the musculoskeletal system and connective tissue: Secondary | ICD-10-CM | POA: Insufficient documentation

## 2013-01-06 DIAGNOSIS — Z79899 Other long term (current) drug therapy: Secondary | ICD-10-CM | POA: Insufficient documentation

## 2013-01-06 DIAGNOSIS — M069 Rheumatoid arthritis, unspecified: Secondary | ICD-10-CM | POA: Insufficient documentation

## 2013-01-06 DIAGNOSIS — Z8639 Personal history of other endocrine, nutritional and metabolic disease: Secondary | ICD-10-CM | POA: Insufficient documentation

## 2013-01-06 MED ORDER — KETOROLAC TROMETHAMINE 30 MG/ML IJ SOLN
30.0000 mg | Freq: Once | INTRAMUSCULAR | Status: DC
  Filled 2013-01-06: qty 1

## 2013-01-06 NOTE — ED Notes (Signed)
Pt states she does not want any medicine - refused toradol injection for pain- and only wanted a Rx for prednisone. IV access discontinued per EDP order, and pt instructed she could leave as directed by EDP.  Pt wants discharge papers, and states she will wait on them.

## 2013-01-06 NOTE — ED Provider Notes (Signed)
History    CSN: [id number] Arrival date & time 01/06/13  1250  First MD Initiated Contact with Patient 01/06/13 1330     Chief Complaint  Patient presents with  . Chest Pain  . Abdominal Pain   (Consider location/radiation/quality/duration/timing/severity/associated sxs/prior Treatment) HPI Patient states she has been having pain and points to her epigastric area for the past 3 days. When asked to describe the pain she states "it's sore". She states it hurts to breathe and it hurts when she changes positions. She denies nausea, vomiting, diarrhea, constipation, swelling in her legs. She states she "was" having cough. She denies fever. She states the pain is constant. She states she's had pain before with costochondritis. She states she was admitted 3-4 months ago at [facility] for the same thing. Please be noted that this patient was hard to get history from. She seemed angry when I entered the room. She resented answering any questions and acted as if I should know all about her problems. Because patient had pointed to her upper epigastric area as her pain area when I mentioned abdominal pain she quickly yelled at me and told me it's not her abdomen it was her chest that was hurting.   Patient lives in [place].  PCP Dr Michel Santee in [facility] in [place]  Past Medical History  Diagnosis Date  . Back pain   . Fibromyalgia   . Osteoporosis   . Rheumatoid arthritis(714.0)   . DJD (degenerative joint disease)   . Vitamin B12 deficiency 09/24/2011  . Hypoalbuminemia 09/24/2011     Past Surgical History  Procedure Laterality Date  . Abdominal hysterectomy       No family history on file. History  Substance Use Topics  . Smoking status: Current Every Day Smoker -- 0.25 packs/day    Types: Cigarettes  . Smokeless tobacco: Not on file  . Alcohol Use: No   Lives with father On disability for fibromyalgia   OB History   Grav Para Term Preterm  Abortions TAB SAB Ect Mult Living                 Review of Systems  All other systems reviewed and are negative.    Allergies  Methylprednisolone sodium succinate; Other; and Polysorbate  Home Medications   Current Outpatient Rx  Name  Route  Sig  Dispense  Refill  . beclomethasone (QVAR) 80 MCG/ACT inhaler   Inhalation   Inhale 1 puff into the lungs 2 (two) times daily.         . cetirizine (ZYRTEC) 10 MG tablet   Oral   Take 10 mg by mouth daily.         . cyclobenzaprine (FLEXERIL) 5 MG tablet   Oral   Take 5 mg by mouth daily as needed for muscle spasms.         . diphenhydrAMINE (BENADRYL) 25 MG tablet   Oral   Take 25 mg by mouth every 6 (six) hours as needed for itching or allergies.         . hydrOXYzine (VISTARIL) 25 MG capsule   Oral   Take 25-100 mg by mouth 3 (three) times daily.         . ranitidine (ZANTAC) 150 MG tablet   Oral   Take 300 mg by mouth 2 (two) times daily.         . vitamin B-12 (CYANOCOBALAMIN) 1000 MCG tablet   Oral   Take 2,000 mcg by  mouth daily.         Marland Kitchen EXPIRED: diphenhydrAMINE (BENADRYL) 50 MG capsule   Oral   Take 1 capsule (50 mg total) by mouth every 6 (six) hours. TAKE UNTIL YOUR SYMPTOMS COMPLETELY RESOLVE.   30 capsule       BP 118/82  Pulse 86  Temp(Src) 98.1 F (36.7 C) (Oral)  Ht 5\' 3"  (1.6 m)  Wt 155 lb (70.308 kg)  BMI 27.46 kg/m2  SpO2 98%  Vital signs normal   Physical Exam  Nursing note and vitals reviewed. Constitutional: She is oriented to person, place, and time. She appears well-developed and well-nourished.  Non-toxic appearance. She does not appear ill. No distress.  HENT:  Head: Normocephalic and atraumatic.  Right Ear: External ear normal.  Left Ear: External ear normal.  Nose: Nose normal. No mucosal edema or rhinorrhea.  Mouth/Throat: Oropharynx is clear and moist and mucous membranes are normal. No dental abscesses or edematous.  Eyes: Conjunctivae and EOM are normal.  Pupils are equal, round, and reactive to light.  Neck: Normal range of motion and full passive range of motion without pain. Neck supple.  Cardiovascular: Normal rate, regular rhythm and normal heart sounds.  Exam reveals no gallop and no friction rub.   No murmur heard. Pulmonary/Chest: Effort normal and breath sounds normal. No respiratory distress. She has no wheezes. She has no rhonchi. She has no rales. She exhibits tenderness. She exhibits no crepitus.    Abdominal: Soft. Normal appearance and bowel sounds are normal. She exhibits no distension. There is no tenderness. There is no rebound and no guarding.  Musculoskeletal: Normal range of motion. She exhibits no edema and no tenderness.  Moves all extremities well.   Neurological: She is alert and oriented to person, place, and time. She has normal strength. No cranial nerve deficit.  Skin: Skin is warm, dry and intact. No rash noted. No erythema. No pallor.  Psychiatric: Her speech is normal and behavior is normal. Her mood appears not anxious. Her affect is angry.    ED Course  Procedures (including critical care time)  Medications  ketorolac (TORADOL) 30 MG/ML injection 30 mg (30 mg Intravenous Not Given 01/06/13 1401)   Pt kept acting like she was going to grab my hand and I had to ask her several times to keep her hand down.  Pt yelling at me that she can't take any medication that her allergist doesn't approve. She states she is just going to leave.  Nurses state after I left the room patient said she was only here to get prednisone.   1. Costochondritis     Pt left AMA  Devoria Albe, MD, FACEP    MDM    Ward Givens, MD 01/06/13 1420
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
## 2013-01-06 NOTE — ED Notes (Signed)
Reports epigastric pain and chest pain x 3 days; tender to mild palpation between breasts, and states the pain is worse with movement.  Denies n/v/d.  Reports feeling short of breath "for awhile".  States was admitted for the same at [facility] in [place] 3-4 months ago.

## 2013-01-06 NOTE — ED Notes (Signed)
States that we need to call her "specialist" prior to giving her any medication - states, "I don't know what I can take anymore".

## 2014-04-05 ENCOUNTER — Emergency Department (HOSPITAL_COMMUNITY)
Admission: EM | Admit: 2014-04-05 | Discharge: 2014-04-05 | Disposition: A | Discharge status: Home / Self Care | Payer: Medicare Other | Attending: Emergency Medicine | Admitting: Emergency Medicine

## 2014-04-05 ENCOUNTER — Encounter (HOSPITAL_COMMUNITY): Payer: Self-pay | Admitting: Emergency Medicine

## 2014-04-05 ENCOUNTER — Emergency Department (HOSPITAL_COMMUNITY): Payer: Medicare Other

## 2014-04-05 DIAGNOSIS — IMO0001 Reserved for inherently not codable concepts without codable children: Secondary | ICD-10-CM | POA: Insufficient documentation

## 2014-04-05 DIAGNOSIS — IMO0002 Reserved for concepts with insufficient information to code with codable children: Secondary | ICD-10-CM | POA: Diagnosis not present

## 2014-04-05 DIAGNOSIS — E538 Deficiency of other specified B group vitamins: Secondary | ICD-10-CM | POA: Insufficient documentation

## 2014-04-05 DIAGNOSIS — F17200 Nicotine dependence, unspecified, uncomplicated: Secondary | ICD-10-CM | POA: Insufficient documentation

## 2014-04-05 DIAGNOSIS — Z8639 Personal history of other endocrine, nutritional and metabolic disease: Secondary | ICD-10-CM | POA: Diagnosis not present

## 2014-04-05 DIAGNOSIS — Z8739 Personal history of other diseases of the musculoskeletal system and connective tissue: Secondary | ICD-10-CM | POA: Insufficient documentation

## 2014-04-05 DIAGNOSIS — R0789 Other chest pain: Secondary | ICD-10-CM

## 2014-04-05 DIAGNOSIS — R079 Chest pain, unspecified: Secondary | ICD-10-CM | POA: Insufficient documentation

## 2014-04-05 DIAGNOSIS — Z862 Personal history of diseases of the blood and blood-forming organs and certain disorders involving the immune mechanism: Secondary | ICD-10-CM | POA: Insufficient documentation

## 2014-04-05 DIAGNOSIS — Z79899 Other long term (current) drug therapy: Secondary | ICD-10-CM | POA: Insufficient documentation

## 2014-04-05 MED ORDER — OXYCODONE-ACETAMINOPHEN 5-325 MG PO TABS: 1.0000 | ORAL_TABLET | Freq: Once | ORAL | Status: DC

## 2014-04-05 MED ORDER — OXYCODONE-ACETAMINOPHEN 5-325 MG PO TABS
1.0000 | ORAL_TABLET | ORAL | Status: DC | PRN
Start: 2014-04-05 — End: 2014-04-29

## 2014-04-05 NOTE — ED Notes (Signed)
PT c/o soreness to center of chest x7 days and c/o pain when with expiratory respirations. PT denies any SOB, n/v and states this is muscle pain that I've had before.

## 2014-04-05 NOTE — ED Provider Notes (Signed)
CSN: [id number]     Arrival date & time 04/05/14  0808 History  This chart was scribed for Joya Gaskins, MD by Richarda Overlie, ED Scribe. This patient was seen in room APA05/APA05 and the patient's care was started 8:30 AM.    Chief Complaint  Patient presents with  . Chest Pain    Patient is a 53 y.o. female presenting with chest pain. The history is provided by the patient. No language interpreter was used.  Chest Pain Pain quality: tightness   Pain quality: not radiating   Pain radiates to:  Does not radiate Pain radiates to the back: no   Pain severity:  Mild Onset quality:  Unable to specify Duration:  1 week Timing:  Intermittent Worsened by:  Movement and deep breathing Associated symptoms: no back pain, no cough, no fever, no lower extremity edema, no nausea, no shortness of breath and not vomiting   Risk factors: no diabetes mellitus, no high cholesterol, no hypertension and no prior DVT/PE    HPI Comments: Melissa Berg is a 53 y.o. female who presents to the Emergency Department complaining of constant, waxing and waning generalized chest soreness for the past week. She describes the pain as non radiating and states that it feels like she "pulled a muscle". She denies recent injury or trauma. The patient reports that deep breathing and certain movements worsen the pain. She denies fever, nausea, cough, SOB, and abdominal pain. She states that she had rheumatoid arthritis a year ago, and currently has fibromyalgia and degenerative disc disease. She denies a personal or family history of cardiac disease, CVA, DVT/PE.    Past Medical History  Diagnosis Date  . Back pain   . Fibromyalgia   . Osteoporosis   . Rheumatoid arthritis(714.0)   . DJD (degenerative joint disease)   . Vitamin B12 deficiency 09/24/2011  . Hypoalbuminemia 09/24/2011   Past Surgical History  Procedure Laterality Date  . Abdominal hysterectomy     Family history - negative for CAD History   Substance Use Topics  . Smoking status: Current Every Day Smoker -- 0.25 packs/day    Types: Cigarettes  . Smokeless tobacco: Not on file  . Alcohol Use: No   OB History   Grav Para Term Preterm Abortions TAB SAB Ect Mult Living                 Review of Systems  Constitutional: Negative for fever.  Respiratory: Negative for cough and shortness of breath.   Cardiovascular: Positive for chest pain.  Gastrointestinal: Negative for nausea and vomiting.  Musculoskeletal: Negative for back pain.  All other systems reviewed and are negative.    Allergies  Methylprednisolone sodium succinate; Other; and Polysorbate  Home Medications   Prior to Admission medications   Medication Sig Start Date End Date Taking? Authorizing Provider  beclomethasone (QVAR) 80 MCG/ACT inhaler Inhale 1 puff into the lungs 2 (two) times daily.    Historical Provider, MD  cetirizine (ZYRTEC) 10 MG tablet Take 10 mg by mouth daily.    Historical Provider, MD  cyclobenzaprine (FLEXERIL) 5 MG tablet Take 5 mg by mouth daily as needed for muscle spasms.    Historical Provider, MD  diphenhydrAMINE (BENADRYL) 25 MG tablet Take 25 mg by mouth every 6 (six) hours as needed for itching or allergies.    Historical Provider, MD  diphenhydrAMINE (BENADRYL) 50 MG capsule Take 1 capsule (50 mg total) by mouth every 6 (six) hours. TAKE UNTIL YOUR SYMPTOMS COMPLETELY  RESOLVE. 09/24/11 10/04/11  Elliot Cousin, MD  hydrOXYzine (VISTARIL) 25 MG capsule Take 25-100 mg by mouth 3 (three) times daily.    Historical Provider, MD  ranitidine (ZANTAC) 150 MG tablet Take 300 mg by mouth 2 (two) times daily.    Historical Provider, MD  vitamin B-12 (CYANOCOBALAMIN) 1000 MCG tablet Take 2,000 mcg by mouth daily.    Historical Provider, MD   BP 139/83  Pulse 60  Temp(Src) 97.8 F (36.6 C) (Oral)  Resp 18  Ht 5\' 4"  (1.626 m)  Wt 155 lb (70.308 kg)  BMI 26.59 kg/m2  SpO2 100%  Physical Exam  Nursing note and vitals  reviewed.  CONSTITUTIONAL: Well developed/well nourished, no distress noted HEAD: Normocephalic/atraumatic EYES: EOMI/PERRL ENMT: Mucous membranes moist NECK: supple no meningeal signs SPINE:entire spine nontender CV: S1/S2 noted, no murmurs/rubs/gallops noted Chest: Diffuse anterior chest wall tenderness, no bruising or crepitus LUNGS: Lungs are clear to auscultation bilaterally, no apparent distress ABDOMEN: soft, nontender, no rebound or guarding GU:no cva tenderness NEURO: Pt is awake/alert, moves all extremitiesx4 EXTREMITIES: pulses normal, full ROM, no edema or calf tenderness SKIN: warm, color normal PSYCH: no abnormalities of mood noted   ED Course  Procedures DIAGNOSTIC STUDIES: Oxygen Saturation is 100% on RA, normal by my interpretation.    COORDINATION OF CARE: 8:36 AM Discussed treatment plan with pt at bedside and pt agreed to plan. Will order CXR.   Pt well appearing CP is clearly reproducible.  She reports having this previously She has no associated symptoms I doubt ACS/PE/Dissection Appropriate for d/c home  Imaging Review Dg Chest 2 View  04/05/2014   CLINICAL DATA:  Chest pain for 1 day.  EXAM: CHEST  2 VIEW  COMPARISON:  Single view of the chest 09/14/2012.  FINDINGS: Heart size and mediastinal contours are within normal limits. Both lungs are clear. Visualized skeletal structures are unremarkable.  IMPRESSION: Negative exam.   Electronically Signed   By: 11/14/2012 M.D.   On: 04/05/2014 09:00     EKG Interpretation   Date/Time:  Monday April 05 2014 08:27:13 EDT Ventricular Rate:  56 PR Interval:  134 QRS Duration: 77 QT Interval:  468 QTC Calculation: 452 R Axis:   49 Text Interpretation:  Sinus rhythm artifact noted ECG OTHERWISE WITHIN  NORMAL LIMITS No significant change since last tracing Confirmed by  12-23[birth year]1977  MD, Bebe Shaggy (Dorinda Hill) on 04/05/2014 8:56:15 AM      MDM   Final diagnoses:  Other chest pain    xrays reviewed  and considered Nursing notes including past medical history and social history reviewed and considered in documentation Previous records reviewed and considered    I personally performed the services described in this documentation, which was scribed in my presence. The recorded information has been reviewed and is accurate.      04/07/2014, MD 04/05/14 [phone]

## 2014-04-05 NOTE — Discharge Instructions (Signed)

## 2014-04-29 ENCOUNTER — Emergency Department (HOSPITAL_COMMUNITY)
Admission: EM | Admit: 2014-04-29 | Discharge: 2014-04-29 | Disposition: A | Discharge status: Home / Self Care | Payer: Medicare Other | Attending: Emergency Medicine | Admitting: Emergency Medicine

## 2014-04-29 ENCOUNTER — Encounter (HOSPITAL_COMMUNITY): Payer: Self-pay | Admitting: Emergency Medicine

## 2014-04-29 ENCOUNTER — Emergency Department (HOSPITAL_COMMUNITY): Payer: Medicare Other

## 2014-04-29 DIAGNOSIS — Z8739 Personal history of other diseases of the musculoskeletal system and connective tissue: Secondary | ICD-10-CM | POA: Diagnosis not present

## 2014-04-29 DIAGNOSIS — Z79899 Other long term (current) drug therapy: Secondary | ICD-10-CM | POA: Diagnosis not present

## 2014-04-29 DIAGNOSIS — S71102A Unspecified open wound, left thigh, initial encounter: Secondary | ICD-10-CM | POA: Diagnosis not present

## 2014-04-29 DIAGNOSIS — Z8639 Personal history of other endocrine, nutritional and metabolic disease: Secondary | ICD-10-CM | POA: Diagnosis not present

## 2014-04-29 DIAGNOSIS — Y92481 Parking lot as the place of occurrence of the external cause: Secondary | ICD-10-CM | POA: Diagnosis not present

## 2014-04-29 DIAGNOSIS — S71101A Unspecified open wound, right thigh, initial encounter: Secondary | ICD-10-CM | POA: Insufficient documentation

## 2014-04-29 DIAGNOSIS — W3400XA Accidental discharge from unspecified firearms or gun, initial encounter: Secondary | ICD-10-CM | POA: Insufficient documentation

## 2014-04-29 DIAGNOSIS — Z720 Tobacco use: Secondary | ICD-10-CM | POA: Diagnosis not present

## 2014-04-29 DIAGNOSIS — Y9389 Activity, other specified: Secondary | ICD-10-CM | POA: Insufficient documentation

## 2014-04-29 MED ORDER — HYDROGEN PEROXIDE 3 % EX SOLN
CUTANEOUS | Status: AC
  Filled 2014-04-29: qty 473

## 2014-04-29 MED ORDER — TETANUS-DIPHTH-ACELL PERTUSSIS 5-2.5-18.5 LF-MCG/0.5 IM SUSP
0.5000 mL | Freq: Once | INTRAMUSCULAR | Status: DC
  Filled 2014-04-29: qty 0.5
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
## 2014-10-05 ENCOUNTER — Encounter (HOSPITAL_COMMUNITY): Payer: Self-pay | Admitting: *Deleted

## 2014-10-05 ENCOUNTER — Emergency Department (HOSPITAL_COMMUNITY): Payer: Medicare Other

## 2014-10-05 ENCOUNTER — Emergency Department (HOSPITAL_COMMUNITY)
Admission: EM | Admit: 2014-10-05 | Discharge: 2014-10-05 | Disposition: A | Discharge status: Home / Self Care | Payer: Medicare Other | Attending: Emergency Medicine | Admitting: Emergency Medicine

## 2014-10-05 DIAGNOSIS — Z720 Tobacco use: Secondary | ICD-10-CM | POA: Diagnosis not present

## 2014-10-05 DIAGNOSIS — Z8639 Personal history of other endocrine, nutritional and metabolic disease: Secondary | ICD-10-CM | POA: Insufficient documentation

## 2014-10-05 DIAGNOSIS — M549 Dorsalgia, unspecified: Secondary | ICD-10-CM | POA: Insufficient documentation

## 2014-10-05 DIAGNOSIS — R0789 Other chest pain: Secondary | ICD-10-CM

## 2014-10-05 DIAGNOSIS — R079 Chest pain, unspecified: Secondary | ICD-10-CM | POA: Diagnosis present

## 2014-10-05 DIAGNOSIS — Z79899 Other long term (current) drug therapy: Secondary | ICD-10-CM | POA: Diagnosis not present

## 2014-10-05 DIAGNOSIS — R000 Tachycardia, unspecified: Secondary | ICD-10-CM | POA: Insufficient documentation

## 2014-10-05 LAB — BASIC METABOLIC PANEL
Anion gap: 6 (ref 5–15)
BUN: 15 mg/dL (ref 6–23)
CHLORIDE: 103 mmol/L (ref 96–112)
CO2: 29 mmol/L (ref 19–32)
CREATININE: 0.7 mg/dL (ref 0.50–1.10)
Calcium: 8.9 mg/dL (ref 8.4–10.5)
GFR calc non Af Amer: >90 mL/min (ref >90)
GLUCOSE: 109 mg/dL — AB (ref 70–99)
POTASSIUM: 3.1 mmol/L — AB (ref 3.5–5.1)
Sodium: 138 mmol/L (ref 135–145)

## 2014-10-05 LAB — CBC WITH DIFFERENTIAL/PLATELET
BASOS ABS: 0 10*3/uL (ref 0.0–0.1)
BASOS PCT: 0 % (ref 0–1)
Eosinophils Absolute: 0 10*3/uL (ref 0.0–0.7)
Eosinophils Relative: 0 % (ref 0–5)
HCT: 41.6 % (ref 36.0–46.0)
HEMOGLOBIN: 13.9 g/dL (ref 12.0–15.0)
LYMPHS ABS: 3.1 10*3/uL (ref 0.7–4.0)
LYMPHS PCT: 27 % (ref 12–46)
MCH: 31.8 pg (ref 26.0–34.0)
MCHC: 33.4 g/dL (ref 30.0–36.0)
MCV: 95.2 fL (ref 78.0–100.0)
MONOS PCT: 7 % (ref 3–12)
Monocytes Absolute: 0.8 10*3/uL (ref 0.1–1.0)
NEUTROS ABS: 7.5 10*3/uL (ref 1.7–7.7)
Neutrophils Relative %: 66 % (ref 43–77)
PLATELETS: 152 10*3/uL (ref 150–400)
RBC: 4.37 MIL/uL (ref 3.87–5.11)
RDW: 13.1 % (ref 11.5–15.5)
WBC: 11.4 10*3/uL — ABNORMAL HIGH (ref 4.0–10.5)

## 2014-10-05 LAB — D-DIMER, QUANTITATIVE: D-Dimer, Quant: 0.54 ug/mL-FEU — ABNORMAL HIGH (ref 0.00–0.48)

## 2014-10-05 LAB — TROPONIN I: Troponin I: <0.03 ng/mL (ref <0.031)

## 2014-10-05 LAB — CK: Total CK: 63 U/L (ref 7–177)

## 2014-10-05 MED ORDER — IOHEXOL 350 MG/ML SOLN
100.0000 mL | Freq: Once | INTRAVENOUS | Status: AC | PRN
  Administered 2014-10-05: 100 mL via INTRAVENOUS

## 2014-10-05 NOTE — Discharge Instructions (Signed)
Chest Wall Pain There is no evidence of heart attack or blood clot in the lung. Take your antiinflammatories as prescribed. Return to the ED if you develop new or worsening symptoms. Chest wall pain is pain in or around the bones and muscles of your chest. It may take up to 6 weeks to get better. It may take longer if you must stay physically active in your work and activities.  CAUSES  Chest wall pain may happen on its own. However, it may be caused by:  A viral illness like the flu.  Injury.  Coughing.  Exercise.  Arthritis.  Fibromyalgia.  Shingles. HOME CARE INSTRUCTIONS   Avoid overtiring physical activity. Try not to strain or perform activities that cause pain. This includes any activities using your chest or your abdominal and side muscles, especially if heavy weights are used.  Put ice on the sore area.  Put ice in a plastic bag.  Place a towel between your skin and the bag.  Leave the ice on for 15-20 minutes per hour while awake for the first 2 days.  Only take over-the-counter or prescription medicines for pain, discomfort, or fever as directed by your caregiver. SEEK IMMEDIATE MEDICAL CARE IF:   Your pain increases, or you are very uncomfortable.  You have a fever.  Your chest pain becomes worse.  You have new, unexplained symptoms.  You have nausea or vomiting.  You feel sweaty or lightheaded.  You have a cough with phlegm (sputum), or you cough up blood. MAKE SURE YOU:   Understand these instructions.  Will watch your condition.  Will get help right away if you are not doing well or get worse. Document Released: 06/25/2005 Document Revised: 09/17/2011 Document Reviewed: 02/19/2011 [facility] Patient Information 2015 [place], [place]. This information is not intended to replace advice given to you by your health care provider. Make sure you discuss any questions you have with your health care provider.
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
## 2014-10-05 NOTE — ED Notes (Addendum)
Patient refused discharge vital signs. Patient upset about lack of definitive diagnoses. RN attempted to explain to patient all the tests that were completed today and that the tests were reassuring. Patient would not allow RN to finish and kept interrupting. Patient stating we did nothing for her and we were "a bunch of quacks, I guess they give anyone a license now-a-days". Patient dc'd IV herself, and left ED in no distress, continuing to rant to family members.

## 2014-10-05 NOTE — ED Notes (Signed)
Pt says chest "soreness" for 2-3 weeks , has been seen here for same, alert , skin warm and dry,No NVD

## 2014-10-05 NOTE — ED Notes (Signed)
Spoke with staff at Dr. Hansel Starling office and ok for pt to receive contrast.  Pt only needs to avoid polysorbates.

## 2015-04-29 ENCOUNTER — Emergency Department (HOSPITAL_COMMUNITY): Payer: Medicare Other

## 2015-04-29 ENCOUNTER — Encounter (HOSPITAL_COMMUNITY): Payer: Self-pay | Admitting: *Deleted

## 2015-04-29 ENCOUNTER — Emergency Department (HOSPITAL_COMMUNITY)
Admission: EM | Admit: 2015-04-29 | Discharge: 2015-04-29 | Disposition: A | Discharge status: Home / Self Care | Payer: Medicare Other | Attending: Emergency Medicine | Admitting: Emergency Medicine

## 2015-04-29 DIAGNOSIS — Z720 Tobacco use: Secondary | ICD-10-CM | POA: Diagnosis not present

## 2015-04-29 DIAGNOSIS — G8929 Other chronic pain: Secondary | ICD-10-CM | POA: Insufficient documentation

## 2015-04-29 DIAGNOSIS — Z8639 Personal history of other endocrine, nutritional and metabolic disease: Secondary | ICD-10-CM | POA: Insufficient documentation

## 2015-04-29 DIAGNOSIS — R000 Tachycardia, unspecified: Secondary | ICD-10-CM | POA: Insufficient documentation

## 2015-04-29 DIAGNOSIS — Y9289 Other specified places as the place of occurrence of the external cause: Secondary | ICD-10-CM | POA: Insufficient documentation

## 2015-04-29 DIAGNOSIS — W01190A Fall on same level from slipping, tripping and stumbling with subsequent striking against furniture, initial encounter: Secondary | ICD-10-CM | POA: Diagnosis not present

## 2015-04-29 DIAGNOSIS — Y998 Other external cause status: Secondary | ICD-10-CM | POA: Insufficient documentation

## 2015-04-29 DIAGNOSIS — S0033XA Contusion of nose, initial encounter: Secondary | ICD-10-CM | POA: Insufficient documentation

## 2015-04-29 DIAGNOSIS — Z8739 Personal history of other diseases of the musculoskeletal system and connective tissue: Secondary | ICD-10-CM | POA: Insufficient documentation

## 2015-04-29 DIAGNOSIS — W19XXXA Unspecified fall, initial encounter: Secondary | ICD-10-CM

## 2015-04-29 DIAGNOSIS — Y9389 Activity, other specified: Secondary | ICD-10-CM | POA: Diagnosis not present

## 2015-04-29 DIAGNOSIS — S0992XA Unspecified injury of nose, initial encounter: Secondary | ICD-10-CM | POA: Diagnosis present

## 2015-04-29 DIAGNOSIS — Z79899 Other long term (current) drug therapy: Secondary | ICD-10-CM | POA: Diagnosis not present

## 2015-04-29 NOTE — Discharge Instructions (Signed)

## 2015-04-29 NOTE — ED Notes (Addendum)
Pt states that her back gave out yesterday and fell, hitting the bridge of her nose on the bed. Bruising to bridge of nose. Chronic back pain as well.

## 2015-04-29 NOTE — ED Provider Notes (Signed)
CSN: [id number]     Arrival date & time 04/29/15  1612 History   None    Chief Complaint  Patient presents with  . Fall     (Consider location/radiation/quality/duration/timing/severity/associated sxs/prior Treatment) Patient is a 54 y.o. female presenting with fall. The history is provided by the patient. No language interpreter was used.  Fall This is a new problem. The current episode started yesterday. The problem has been unchanged.   Melissa Berg is a 54 y.o. female who presents to the ED with hx of chronic low back pain and states that yesterday her back gave out and she fell and hit her face on the bed. She complains of pain and bruising to the bridge of her nose. She denies any other injuries from the fall. She denies LOC.   Past Medical History  Diagnosis Date  . Back pain   . Fibromyalgia   . Osteoporosis   . Rheumatoid arthritis(714.0)   . DJD (degenerative joint disease)   . Vitamin B12 deficiency 09/24/2011  . Hypoalbuminemia 09/24/2011   Past Surgical History  Procedure Laterality Date  . Abdominal hysterectomy     No family history on file. Social History  Substance Use Topics  . Smoking status: Current Every Day Smoker -- 0.25 packs/day    Types: Cigarettes  . Smokeless tobacco: None  . Alcohol Use: No   OB History    No data available     Review of Systems Negative except as stated in HPI   Allergies  Methylprednisolone sodium succinate; Other; and Polysorbate  Home Medications   Prior to Admission medications   Medication Sig Start Date End Date Taking? Authorizing Provider  cetirizine (ZYRTEC) 10 MG tablet Take 10 mg by mouth daily.    Historical Provider, MD  diphenhydrAMINE (BENADRYL) 25 MG tablet Take 25 mg by mouth every 6 (six) hours as needed for itching or allergies.    Historical Provider, MD  hydrOXYzine (VISTARIL) 25 MG capsule Take 25-100 mg by mouth 3 (three) times daily.    Historical Provider, MD  omalizumab Geoffry Paradise) 150 MG  injection Inject 300 mg into the skin every 28 (twenty-eight) days.    Historical Provider, MD  ranitidine (ZANTAC) 150 MG tablet Take 300 mg by mouth 2 (two) times daily.    Historical Provider, MD   BP 159/89 mmHg  Pulse 59  Temp(Src) 98.1 F (36.7 C) (Oral)  Resp 20  Ht 5\' 4"  (1.626 m)  Wt 150 lb (68.04 kg)  BMI 25.73 kg/m2  SpO2 100% Physical Exam  Constitutional: She is oriented to person, place, and time. She appears well-developed and well-nourished. No distress.  HENT:  Head:    Right Ear: No hemotympanum.  Left Ear: No hemotympanum.  Nose: Mucosal edema and sinus tenderness present. No nasal septal hematoma. No epistaxis.  Tenderness and ecchymosis of nasal bridge   Eyes: EOM are normal.  Neck: Neck supple.  Cardiovascular: Tachycardia present.   Pulmonary/Chest: Effort normal and breath sounds normal.  Abdominal: Soft. Bowel sounds are normal. There is no tenderness. There is no CVA tenderness.  Musculoskeletal: Normal range of motion.  Neurological: She is alert and oriented to person, place, and time. No cranial nerve deficit.  Skin: Skin is warm and dry.  Psychiatric: She has a normal mood and affect. Her behavior is normal.  Nursing note and vitals reviewed.   ED Course  Procedures (including critical care time) Labs Review Labs Reviewed - No data to display  Imaging Review No  fracture  MDM  54 y.o. female with pain and ecchymosis of the nose stable for d/c without fracture. Will treat with ice and she will return if any problems occur. Patient states she has pain medication to take as needed at home.   Final diagnoses:  Contusion, nose, initial encounter  Fall, initial encounter      Janne Napoleon, NP 05/01/15 1837  Samuel Jester, DO 05/03/15 1920

## 2016-04-18 ENCOUNTER — Emergency Department (HOSPITAL_COMMUNITY)
Admission: EM | Admit: 2016-04-18 | Discharge: 2016-04-18 | Disposition: A | Discharge status: Home / Self Care | Payer: Medicare HMO | Attending: Emergency Medicine | Admitting: Emergency Medicine

## 2016-04-18 ENCOUNTER — Encounter (HOSPITAL_COMMUNITY): Payer: Self-pay | Admitting: Emergency Medicine

## 2016-04-18 ENCOUNTER — Emergency Department (HOSPITAL_COMMUNITY): Payer: Medicare HMO

## 2016-04-18 DIAGNOSIS — W010XXA Fall on same level from slipping, tripping and stumbling without subsequent striking against object, initial encounter: Secondary | ICD-10-CM | POA: Insufficient documentation

## 2016-04-18 DIAGNOSIS — F17210 Nicotine dependence, cigarettes, uncomplicated: Secondary | ICD-10-CM | POA: Diagnosis not present

## 2016-04-18 DIAGNOSIS — Y999 Unspecified external cause status: Secondary | ICD-10-CM | POA: Diagnosis not present

## 2016-04-18 DIAGNOSIS — Y929 Unspecified place or not applicable: Secondary | ICD-10-CM | POA: Diagnosis not present

## 2016-04-18 DIAGNOSIS — Z79899 Other long term (current) drug therapy: Secondary | ICD-10-CM | POA: Insufficient documentation

## 2016-04-18 DIAGNOSIS — S4991XA Unspecified injury of right shoulder and upper arm, initial encounter: Secondary | ICD-10-CM | POA: Diagnosis present

## 2016-04-18 DIAGNOSIS — S63511A Sprain of carpal joint of right wrist, initial encounter: Secondary | ICD-10-CM

## 2016-04-18 DIAGNOSIS — S40011A Contusion of right shoulder, initial encounter: Secondary | ICD-10-CM

## 2016-04-18 DIAGNOSIS — Y939 Activity, unspecified: Secondary | ICD-10-CM | POA: Diagnosis not present

## 2016-04-18 MED ORDER — IBUPROFEN 800 MG PO TABS: 800.0000 mg | ORAL_TABLET | Freq: Three times a day (TID) | ORAL | 0 refills | Status: DC

## 2016-04-18 MED ORDER — OXYCODONE-ACETAMINOPHEN 5-325 MG PO TABS
1.0000 | ORAL_TABLET | Freq: Once | ORAL | Status: AC
  Administered 2016-04-18: 1 via ORAL
  Filled 2016-04-18: qty 1

## 2016-04-18 MED ORDER — IBUPROFEN 800 MG PO TABS
800.0000 mg | ORAL_TABLET | Freq: Once | ORAL | Status: AC
Start: 2016-04-18 — End: 2016-04-18
  Administered 2016-04-18: 800 mg via ORAL
  Filled 2016-04-18: qty 1

## 2016-04-18 MED ORDER — HYDROCODONE-ACETAMINOPHEN 5-325 MG PO TABS: ORAL_TABLET | ORAL | 0 refills | Status: DC

## 2016-04-18 NOTE — Discharge Instructions (Signed)
Apply ice packs on and off to your shoulder. Wear the sling as needed but not continuously. Call Dr. Mort Sawyers office to arrange a follow-up appointment in one week if not improving.

## 2016-04-18 NOTE — ED Provider Notes (Signed)
[facility] DEPT Provider Note   CSN: [id number] Arrival date & time: 04/18/16  1256     History   Chief Complaint Chief Complaint  Patient presents with  . Fall  . Shoulder Injury    HPI Naydelin Ziegler is a 55 y.o. female.  HPI  Tyyne Cliett is a 55 y.o. female who presents to the Emergency Department complaining of right shoulder pain since last evening.  She states that she suffered a mechanical fall onto her carport After slipping on a wet surface. She complains of persistent pain to her shoulder that's worse with attempted movement of her right arm. She states she has not tried any therapies or medications for symptom relief. She describes an aching throbbing pain just proximal to her right shoulder joint. She also reports some pain to her right elbow and wrist. She denies head injury or LOC. She also denies numbness or weakness of the right arm.  Past Medical History:  Diagnosis Date  . Back pain   . DJD (degenerative joint disease)   . Fibromyalgia   . Hypoalbuminemia 09/24/2011  . Osteoporosis   . Rheumatoid arthritis(714.0)   . Vitamin B12 deficiency 09/24/2011    Patient Active Problem List   Diagnosis Date Noted  . Vitamin B12 deficiency 09/24/2011  . Hypoalbuminemia 09/24/2011  . 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 Allergic reaction 09/21/2011  . Chronic back pain 09/21/2011  . Hypokalemia 09/21/2011  . Hypoglycemia 09/21/2011  . Anemia 09/21/2011    Past Surgical History:  Procedure Laterality Date  . ABDOMINAL HYSTERECTOMY      OB History    No data available       Home Medications    Prior to Admission medications   Medication Sig Start Date End Date Taking? Authorizing Provider  cetirizine (ZYRTEC) 10 MG tablet Take 10 mg by mouth daily.    Historical Provider, MD  diphenhydrAMINE (BENADRYL) 25 MG tablet Take 25 mg by mouth every 6 (six) hours as needed for itching or allergies.    Historical Provider, MD  hydrOXYzine (VISTARIL) 25 MG  capsule Take 25-100 mg by mouth 3 (three) times daily.    Historical Provider, MD  omalizumab Geoffry Paradise) 150 MG injection Inject 300 mg into the skin every 28 (twenty-eight) days.    Historical Provider, MD  ranitidine (ZANTAC) 150 MG tablet Take 300 mg by mouth 2 (two) times daily.    Historical Provider, MD    Family History History reviewed. No pertinent family history.  Social History Social History  Substance Use Topics  . Smoking status: Current Every Day Smoker    Packs/day: 0.25    Types: Cigarettes  . Smokeless tobacco: Not on file  . Alcohol use No     Allergies   Methylprednisolone sodium succinate; Other; and Polysorbate   Review of Systems Review of Systems  Constitutional: Negative for chills and fever.  Respiratory: Negative for shortness of breath.   Cardiovascular: Negative for chest pain.  Gastrointestinal: Negative for abdominal pain, nausea and vomiting.  Genitourinary: Negative for flank pain.  Musculoskeletal: Positive for arthralgias (Right shoulder pain, right elbow and wrist pain) and joint swelling (Right shoulder).  Skin: Negative for color change and wound.  Neurological: Negative for dizziness, syncope, weakness and headaches.  All other systems reviewed and are negative.    Physical Exam Updated Vital Signs BP 124/74 (BP Location: Left Arm)   Pulse 70   Temp 97.6 F (36.4 C) (Oral)   Resp 16   Ht 5\' 6"  (  1.676 m)   Wt 68 kg   SpO2 100%   BMI 24.21 kg/m   Physical Exam  Constitutional: She is oriented to person, place, and time. She appears well-developed and well-nourished. No distress.  HENT:  Head: Normocephalic and atraumatic.  Mouth/Throat: Oropharynx is clear and moist.  Eyes: EOM are normal. Pupils are equal, round, and reactive to light.  Neck: Normal range of motion.  Cardiovascular: Normal rate and intact distal pulses.   Pulmonary/Chest: Effort normal and breath sounds normal. No respiratory distress. She exhibits no  tenderness.  Abdominal: Soft. She exhibits no distension. There is no tenderness.  Musculoskeletal: She exhibits tenderness. She exhibits no deformity.       Right shoulder: She exhibits tenderness. She exhibits normal pulse and normal strength.       Arms: Focal tenderness to palpation of the upper humerus, mild edema noted. Pain with any attempted range of motion. Mild tenderness to the lateral right elbow and distal right wrist. No bony deformities. Radial pulse brisk, distal sensation intact, cap refill less than 2 seconds.  Neurological: She is alert and oriented to person, place, and time.  Skin: Skin is warm. No rash noted.  Psychiatric: She has a normal mood and affect.  Nursing note and vitals reviewed.    ED Treatments / Results  Labs (all labs ordered are listed, but only abnormal results are displayed) Labs Reviewed - No data to display  EKG  EKG Interpretation None       Radiology Dg Clavicle Right  Result Date: 04/18/2016 CLINICAL DATA:  Fall with pain in the upper extremity EXAM: RIGHT CLAVICLE - 2+ VIEWS COMPARISON:  Chest x-ray 10/05/2014 FINDINGS: There is no evidence of fracture or other focal bone lesions. Soft tissues are unremarkable. IMPRESSION: Negative. Electronically Signed   By: Jasmine Pang M.D.   On: 04/18/2016 14:05   Dg Wrist Complete Right  Result Date: 04/18/2016 CLINICAL DATA:  Fall last p.m., pain entire right arm swelling at the right wrist EXAM: RIGHT WRIST - COMPLETE 3+ VIEW COMPARISON:  None. FINDINGS: There is no evidence of fracture or dislocation. There is no evidence of arthropathy or other focal bone abnormality. Soft tissues are unremarkable. IMPRESSION: No radiographic evidence for acute osseous abnormality. Radiographic follow-up may be performed as clinically indicated. Electronically Signed   By: Jasmine Pang M.D.   On: 04/18/2016 14:04   Dg Humerus Right  Result Date: 04/18/2016 CLINICAL DATA:  Fall last night, pain entire arm  EXAM: RIGHT HUMERUS - 2+ VIEW COMPARISON:  None. FINDINGS: There is no evidence of fracture or other focal bone lesions. Soft tissues are unremarkable. IMPRESSION: Negative. Electronically Signed   By: Jasmine Pang M.D.   On: 04/18/2016 14:03    Procedures Procedures (including critical care time)  Medications Ordered in ED Medications  oxyCODONE-acetaminophen (PERCOCET/ROXICET) 5-325 MG per tablet 1 tablet (not administered)  ibuprofen (ADVIL,MOTRIN) tablet 800 mg (not administered)     Initial Impression / Assessment and Plan / ED Course  I have reviewed the triage vital signs and the nursing notes.  Pertinent labs & imaging results that were available during my care of the patient were reviewed by me and considered in my medical decision making (see chart for details).  Clinical Course    Patient with tenderness to the upper humerus, elbow, and wrist secondary to a fall. X-rays are negative for fractures. She remains neurovascularly intact. Compartments are soft. Injuries are likely musculoskeletal. She agrees to symptomatic treatment and orthopedic  referral given. Patient appears stable for discharge.  Final Clinical Impressions(s) / ED Diagnoses   Final diagnoses:  Contusion of right shoulder, initial encounter  Sprain of carpal joint of right wrist, initial encounter    New Prescriptions New Prescriptions   No medications on file     Pauline Aus, PA-C 04/18/16 1432    Donnetta Hutching, MD 04/20/16 1353

## 2016-04-18 NOTE — ED Triage Notes (Signed)
Pt states she slipped on wet carport and fell on right shoulder last night.

## 2016-07-28 ENCOUNTER — Emergency Department (HOSPITAL_COMMUNITY)
Admission: EM | Admit: 2016-07-28 | Discharge: 2016-07-28 | Disposition: A | Discharge status: Home / Self Care | Payer: Medicare HMO | Attending: Emergency Medicine | Admitting: Emergency Medicine

## 2016-07-28 ENCOUNTER — Encounter (HOSPITAL_COMMUNITY): Payer: Self-pay | Admitting: Emergency Medicine

## 2016-07-28 DIAGNOSIS — M25511 Pain in right shoulder: Secondary | ICD-10-CM | POA: Diagnosis not present

## 2016-07-28 DIAGNOSIS — Z791 Long term (current) use of non-steroidal anti-inflammatories (NSAID): Secondary | ICD-10-CM | POA: Diagnosis not present

## 2016-07-28 DIAGNOSIS — Z79899 Other long term (current) drug therapy: Secondary | ICD-10-CM | POA: Diagnosis not present

## 2016-07-28 DIAGNOSIS — F17210 Nicotine dependence, cigarettes, uncomplicated: Secondary | ICD-10-CM | POA: Diagnosis not present

## 2016-07-28 NOTE — Discharge Instructions (Signed)
Please schedule point with orthopedic in 2 days to follow up on today's visit. Keep using conservative management to treat symptoms until you see orthopedic.  Get help right away if: Your arm, hand, or fingers: Tingle. Become numb. Become swollen. Become painful. Turn white or blue.

## 2016-07-28 NOTE — ED Triage Notes (Signed)
Pt states Right shoulder pain due to a recent fall.  No bruising or swelling.  No numbness/tingling.

## 2016-07-28 NOTE — ED Provider Notes (Signed)
[facility] Provider Note   CSN: [id number] Arrival date & time: 07/28/16  1237     History   Chief Complaint Chief Complaint  Patient presents with  . Shoulder Pain    HPI Melissa Berg is a 56 y.o. female with history of right shoulder pain from mechanical fall 3 months ago presents with right shoulder pain that has worsened in the last 3 weeks. She states that her pain is aching, worse with movement overhead and to decide, and a 7/10. She states that movement over her head and to her side makes pain worse. She states that she has tried blow drying area which helps relieve pain. She has not taken any pain medication because she states she is allergic. She reports having a mechanical fall 3 months ago which is when she first started to have pain. She states that she came here and was feeling better but still has some pain until 3 weeks ago. Patient reports being referred to an orthopedic last time she was here but was not able to due to her father's passing. Patient denies any new falls, injury, or overuse in any way. Patient denies chest pain, shortness of breath, fevers, chills, nausea, vomiting, bruising, swelling, numbness, tingling.  HPI  Past Medical History:  Diagnosis Date  . Back pain   . DJD (degenerative joint disease)   . Fibromyalgia   . Hypoalbuminemia 09/24/2011  . Osteoporosis   . Rheumatoid arthritis(714.0)   . Vitamin B12 deficiency 09/24/2011    Patient Active Problem List   Diagnosis Date Noted  . Vitamin B12 deficiency 09/24/2011  . Hypoalbuminemia 09/24/2011  . 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 Allergic reaction 09/21/2011  . Chronic back pain 09/21/2011  . Hypokalemia 09/21/2011  . Hypoglycemia 09/21/2011  . Anemia 09/21/2011    Past Surgical History:  Procedure Laterality Date  . ABDOMINAL HYSTERECTOMY      OB History    No data available       Home Medications    Prior to Admission medications   Medication Sig Start Date End  Date Taking? Authorizing Provider  cetirizine (ZYRTEC) 10 MG tablet Take 10 mg by mouth daily.    Historical Provider, MD  diphenhydrAMINE (BENADRYL) 25 MG tablet Take 25 mg by mouth every 6 (six) hours as needed for itching or allergies.    Historical Provider, MD  HYDROcodone-acetaminophen (NORCO/VICODIN) 5-325 MG tablet Take one tab po q 4-6 hrs prn pain 04/18/16   Tammy Triplett, PA-C  hydrOXYzine (VISTARIL) 25 MG capsule Take 25-100 mg by mouth 3 (three) times daily.    Historical Provider, MD  ibuprofen (ADVIL,MOTRIN) 800 MG tablet Take 1 tablet (800 mg total) by mouth 3 (three) times daily. 04/18/16   Tammy Triplett, PA-C  omalizumab (XOLAIR) 150 MG injection Inject 300 mg into the skin every 28 (twenty-eight) days.    Historical Provider, MD  ranitidine (ZANTAC) 150 MG tablet Take 300 mg by mouth 2 (two) times daily.    Historical Provider, MD    Family History History reviewed. No pertinent family history.  Social History Social History  Substance Use Topics  . Smoking status: Current Every Day Smoker    Packs/day: 0.25    Types: Cigarettes  . Smokeless tobacco: Never Used  . Alcohol use No     Allergies   Methylprednisolone sodium succinate; Other; and Polysorbate   Review of Systems Review of Systems  Constitutional: Negative for chills and fever.  Respiratory: Negative for shortness of breath.  Cardiovascular: Negative for chest pain.  Gastrointestinal: Negative for abdominal pain, diarrhea, nausea and vomiting.  Genitourinary: Negative for difficulty urinating and dysuria.  Musculoskeletal: Positive for arthralgias and myalgias. Negative for gait problem, joint swelling, neck pain and neck stiffness.  Skin: Negative for color change, rash and wound.  Neurological: Negative for numbness.     Physical Exam Updated Vital Signs BP 146/87 (BP Location: Left Arm)   Pulse 94   Temp 98 F (36.7 C) (Oral)   Resp 18   Ht 5\' 4"  (1.626 m)   Wt 68 kg   SpO2 98%    BMI 25.75 kg/m   Physical Exam  Constitutional: She is oriented to person, place, and time. She appears well-developed and well-nourished.  HENT:  Head: Normocephalic and atraumatic.  Nose: Nose normal.  Mouth/Throat: Oropharynx is clear and moist.  Eyes: EOM are normal. Pupils are equal, round, and reactive to light.  Neck: Normal range of motion.  Cardiovascular: Normal rate, normal heart sounds and intact distal pulses.   Intact distal pulses  Pulmonary/Chest: Effort normal. No respiratory distress.  Musculoskeletal: She exhibits tenderness. She exhibits no deformity.  No redness, swelling, deformity noted on right shoulder. Right shoulder has limited range of motion overhead and external rotation secondary to pain. Patient has good flexion, internal rotation. Limited abduction past 90 of right arm. Mild tenderness to palpation to shoulder joint. Tenderness to insertion of medial deltoid of right arm.  Neers test positive. Empty can test positive. Cross over test negative  Neurological: She is alert and oriented to person, place, and time.  Full sensation intact of all 4 extremities. Muscle strength 5/5 both arms.  Skin: Skin is warm. No erythema.  Psychiatric: She has a normal mood and affect. Her behavior is normal.  Nursing note and vitals reviewed.    ED Treatments / Results  Labs (all labs ordered are listed, but only abnormal results are displayed) Labs Reviewed - No data to display  EKG  EKG Interpretation None       Radiology No results found.  Procedures Procedures (including critical care time)  Medications Ordered in ED Medications - No data to display   Initial Impression / Assessment and Plan / ED Course  I have reviewed the triage vital signs and the nursing notes.  Pertinent labs & imaging results that were available during my care of the patient were reviewed by me and considered in my medical decision making (see chart for details).    Patient  had x-ray done 3 months ago at the time of her mechanical fall incident with no findings of fractures or dislocations. New X-ray not warranted for today's visit due to history, presentation, good range of motion of shoulder joint. Not concerned with any possible fractures or dislocation this time. Pt advised to follow up with orthopedics in 2 days. Conservative therapy recommended and discussed. Patient in NAD, VSS, afebrile. Patient will be dc home & is agreeable with above plan. Return precautions given.   Final Clinical Impressions(s) / ED Diagnoses   Final diagnoses:  Acute pain of right shoulder    New Prescriptions Discharge Medication List as of 07/28/2016  3:52 PM       [address] [place], [place] 07/28/16 1627    07/30/16, MD 07/29/16 1009

## 2016-08-06 ENCOUNTER — Ambulatory Visit (INDEPENDENT_AMBULATORY_CARE_PROVIDER_SITE_OTHER): Payer: Medicare HMO | Admitting: Orthopedic Surgery

## 2016-08-06 ENCOUNTER — Encounter: Payer: Self-pay | Admitting: Orthopedic Surgery

## 2016-08-06 ENCOUNTER — Ambulatory Visit (INDEPENDENT_AMBULATORY_CARE_PROVIDER_SITE_OTHER): Payer: Medicare HMO

## 2016-08-06 VITALS — BP 137/89 | HR 90 | Wt 143.0 lb

## 2016-08-06 DIAGNOSIS — M7551 Bursitis of right shoulder: Secondary | ICD-10-CM

## 2016-08-06 DIAGNOSIS — M25511 Pain in right shoulder: Secondary | ICD-10-CM

## 2016-08-06 NOTE — Patient Instructions (Signed)
-   Start therapy

## 2016-08-06 NOTE — Progress Notes (Signed)
Patient ID: Melissa Berg, female   DOB: 1960-11-05, 56 y.o.   MRN: [id number]  Chief Complaint  Patient presents with  . Shoulder Pain    right shoulder pain s/p fall 04/16/17    HPI Melissa Berg is a 56 y.o. female.  Melissa Berg 3 months ago presents with left shoulder pain 56 year old female fell 3 months ago presents with pain over the left deltoid with painful range of motion with forward elevation and abduction. She reports a dull aching pain she reports night symptoms moderate to severe when at its worst now ongoing for 3 months no prior treatment    Review of Systems Review of Systems  Constitutional: Negative for fever.  Neurological: Negative for weakness and numbness.    Past Medical History:  Diagnosis Date  . Back pain   . DJD (degenerative joint disease)   . Fibromyalgia   . Hypoalbuminemia 09/24/2011  . Osteoporosis   . Rheumatoid arthritis(714.0)   . Vitamin B12 deficiency 09/24/2011    Past Surgical History:  Procedure Laterality Date  . ABDOMINAL HYSTERECTOMY       The patient was quizzed about their family history and reported no history of bleeding problems or anesthesia problems in their family  Social History Social History  Substance Use Topics  . Smoking status: Current Every Day Smoker    Packs/day: 0.25    Types: Cigarettes  . Smokeless tobacco: Never Used  . Alcohol use No    Allergies  Allergen Reactions  . Methylprednisolone Sodium Succinate Swelling  . Other Hives, Itching and Swelling    Solumedrol REACTION: Severe facial, tongue, and throat swelling  . Polysorbate Anaphylaxis and Swelling    (80)    Current Outpatient Prescriptions  Medication Sig Dispense Refill  . cetirizine (ZYRTEC) 10 MG tablet Take 10 mg by mouth daily.    Marland Kitchen HYDROcodone-acetaminophen (NORCO/VICODIN) 5-325 MG tablet Take one tab po q 4-6 hrs prn pain 10 tablet 0  . hydrOXYzine (VISTARIL) 25 MG capsule Take 25-100 mg by mouth 3 (three) times daily.    .  ranitidine (ZANTAC) 150 MG tablet Take 300 mg by mouth 2 (two) times daily.    . diphenhydrAMINE (BENADRYL) 25 MG tablet Take 25 mg by mouth every 6 (six) hours as needed for itching or allergies.    Marland Kitchen ibuprofen (ADVIL,MOTRIN) 800 MG tablet Take 1 tablet (800 mg total) by mouth 3 (three) times daily. 21 tablet 0  . omalizumab (XOLAIR) 150 MG injection Inject 300 mg into the skin every 28 (twenty-eight) days.     No current facility-administered medications for this visit.      Physical Exam Blood pressure 137/89, pulse 90, weight 143 lb (64.9 kg). Physical Exam The patient is well developed well nourished and well groomed.  Orientation to person place and time is normal  Mood is pleasant.  Ambulatory status Normal Cervical spine exam is as follows no tenderness Ortho Exam Right shoulder  Examination: Inspection reveals tenderness around the peri-acromial region. The patient has decreased range of motion with 120 painful FLEXION 50 EXT ROT  and grade 5/ 5 motor function of the rotator cuff. Stability in abduction external rotation is normal.   Neurovascular examination is intact and the lymph nodes in the axilla and supraclavicular regions are normal   The opposite shoulder left exhibits normal range of motion stability and strength neurovascular exam is intact, lymph nodes are negative and there is no swelling or tenderness   Data Reviewed IMAGING: AP/LAT right Shoulder: I  have read and interpret the x-ray as follows:   no fracture   Assessment    Right SHOULDER  ROTATOR CUFF SYNDROME SHOULDER PAIN     Plan She says she cannot take ibuprofen she is on hydrocodone and should continue with that for severe pain  We did not inject her because she is allergic to Solu-Medrol although she did say she can take prednisone  Recommend physical therapy for 6 weeks and then reassess the situation if needed MRI can be ordered.  Fuller Canada, MD 08/06/2016 4:30 PM

## 2016-10-01 ENCOUNTER — Ambulatory Visit: Payer: Medicare HMO | Admitting: Orthopedic Surgery

## 2017-03-04 ENCOUNTER — Emergency Department (HOSPITAL_COMMUNITY): Payer: Medicare HMO

## 2017-03-04 ENCOUNTER — Emergency Department (HOSPITAL_COMMUNITY)
Admission: EM | Admit: 2017-03-04 | Discharge: 2017-03-04 | Disposition: A | Discharge status: Home / Self Care | Payer: Medicare HMO | Attending: Emergency Medicine | Admitting: Emergency Medicine

## 2017-03-04 ENCOUNTER — Encounter (HOSPITAL_COMMUNITY): Payer: Self-pay | Admitting: Emergency Medicine

## 2017-03-04 DIAGNOSIS — Z79899 Other long term (current) drug therapy: Secondary | ICD-10-CM | POA: Insufficient documentation

## 2017-03-04 DIAGNOSIS — F17210 Nicotine dependence, cigarettes, uncomplicated: Secondary | ICD-10-CM | POA: Diagnosis not present

## 2017-03-04 DIAGNOSIS — S20211A Contusion of right front wall of thorax, initial encounter: Secondary | ICD-10-CM | POA: Insufficient documentation

## 2017-03-04 DIAGNOSIS — Y92013 Bedroom of single-family (private) house as the place of occurrence of the external cause: Secondary | ICD-10-CM | POA: Diagnosis not present

## 2017-03-04 DIAGNOSIS — W01198A Fall on same level from slipping, tripping and stumbling with subsequent striking against other object, initial encounter: Secondary | ICD-10-CM | POA: Insufficient documentation

## 2017-03-04 DIAGNOSIS — Y999 Unspecified external cause status: Secondary | ICD-10-CM | POA: Insufficient documentation

## 2017-03-04 DIAGNOSIS — Y9389 Activity, other specified: Secondary | ICD-10-CM | POA: Diagnosis not present

## 2017-03-04 DIAGNOSIS — M069 Rheumatoid arthritis, unspecified: Secondary | ICD-10-CM | POA: Insufficient documentation

## 2017-03-04 DIAGNOSIS — S299XXA Unspecified injury of thorax, initial encounter: Secondary | ICD-10-CM | POA: Diagnosis present

## 2017-03-04 MED ORDER — PREDNISONE 10 MG PO TABS: ORAL_TABLET | ORAL | 0 refills | Status: DC

## 2017-03-04 MED ORDER — PREDNISONE 10 MG PO TABS
60.0000 mg | ORAL_TABLET | Freq: Once | ORAL | Status: AC
  Administered 2017-03-04: 60 mg via ORAL
  Filled 2017-03-04: qty 1

## 2017-03-04 NOTE — ED Provider Notes (Signed)
[facility]-EMERGENCY DEPT Provider Note   CSN: [id number] Arrival date & time: 03/04/17  2013     History   Chief Complaint Chief Complaint  Patient presents with  . Chest Pain    HPI Melissa Berg is a 56 y.o. female presenting with right-sided chest pain due to a trauma which occurred 3 days ago.  She got out of bed to go to the bathroom when she tripped and hit her right chest against her nightstand causing injury.  She has had persistent pain since despite using a heating pad.  She has had no medications for this injury stating she is allergic to most medications.  She denies shortness of breath but has increased pain with deep inspiration.  She denies any other injuries.     HPI  Past Medical History:  Diagnosis Date  . Back pain   . DJD (degenerative joint disease)   . Fibromyalgia   . Hypoalbuminemia 09/24/2011  . Osteoporosis   . Rheumatoid arthritis(714.0)   . Vitamin B12 deficiency 09/24/2011    Patient Active Problem List   Diagnosis Date Noted  . Vitamin B12 deficiency 09/24/2011  . Hypoalbuminemia 09/24/2011  . 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 Allergic reaction 09/21/2011  . Chronic back pain 09/21/2011  . Hypokalemia 09/21/2011  . Hypoglycemia 09/21/2011  . Anemia 09/21/2011    Past Surgical History:  Procedure Laterality Date  . ABDOMINAL HYSTERECTOMY      OB History    No data available       Home Medications    Prior to Admission medications   Medication Sig Start Date End Date Taking? Authorizing Provider  cetirizine (ZYRTEC) 10 MG tablet Take 10 mg by mouth daily.    [provider]  diphenhydrAMINE (BENADRYL) 25 MG tablet Take 25 mg by mouth every 6 (six) hours as needed for itching or allergies.    [provider]  HYDROcodone-acetaminophen (NORCO/VICODIN) 5-325 MG tablet Take one tab po q 4-6 hrs prn pain 04/18/16   Triplett, Tammy, PA-C  hydrOXYzine (VISTARIL) 25 MG capsule Take 25-100 mg by mouth 3 (three) times daily.     [provider]  ibuprofen (ADVIL,MOTRIN) 800 MG tablet Take 1 tablet (800 mg total) by mouth 3 (three) times daily. 04/18/16   Triplett, Tammy, PA-C  omalizumab (XOLAIR) 150 MG injection Inject 300 mg into the skin every 28 (twenty-eight) days.    [provider]  predniSONE (DELTASONE) 10 MG tablet Take 6 tablets day one, 5 tablets day two, 4 tablets day three, 3 tablets day four, 2 tablets day five, then 1 tablet day six 03/04/17   Mahika Vanvoorhis, Raynelle Fanning, PA-C  ranitidine (ZANTAC) 150 MG tablet Take 300 mg by mouth 2 (two) times daily.    [provider]    Family History No family history on file.  Social History Social History  Substance Use Topics  . Smoking status: Current Every Day Smoker    Packs/day: 0.25    Types: Cigarettes  . Smokeless tobacco: Never Used  . Alcohol use No     Allergies   Methylprednisolone sodium succinate; Other; and Polysorbate   Review of Systems Review of Systems  Constitutional: Negative for fever.  HENT: Negative for congestion and sore throat.   Eyes: Negative.   Respiratory: Negative for chest tightness and shortness of breath.   Cardiovascular: Positive for chest pain.  Gastrointestinal: Negative for abdominal pain, nausea and vomiting.  Genitourinary: Negative.   Musculoskeletal: Negative for arthralgias, joint swelling and neck pain.  Skin: Negative.  Negative for rash and wound.  Neurological: Negative for dizziness, weakness, light-headedness, numbness and headaches.  Psychiatric/Behavioral: Negative.      Physical Exam Updated Vital Signs BP (!) 150/91 (BP Location: Right Arm)   Pulse 86   Temp (!) 97.3 F (36.3 C) (Oral)   Resp 16   Ht 5\' 4"  (1.626 m)   Wt 65.3 kg (144 lb)   SpO2 99%   BMI 24.72 kg/m   Physical Exam  Constitutional: She appears well-developed and well-nourished.  HENT:  Head: Normocephalic and atraumatic.  Eyes: Conjunctivae are normal.  Neck: Normal range of motion.    Cardiovascular: Normal rate, regular rhythm, normal heart sounds and intact distal pulses.   Pulmonary/Chest: Effort normal and breath sounds normal. She has no wheezes. She exhibits tenderness.    Tender to palpation anterior right chest wall beneath right breast.  There is no bruising or hematoma, no palpable deformity.  Breath sounds are equal bilaterally.  No splinting.  Abdominal: Soft. Bowel sounds are normal. There is no tenderness.  Musculoskeletal: Normal range of motion.  Neurological: She is alert.  Skin: Skin is warm and dry.  Psychiatric: She has a normal mood and affect.  Nursing note and vitals reviewed.    ED Treatments / Results  Labs (all labs ordered are listed, but only abnormal results are displayed) Labs Reviewed - No data to display  EKG  EKG Interpretation None       Radiology Dg Ribs Unilateral W/chest Right  Result Date: 03/04/2017 CLINICAL DATA:  Right rib and chest pain after fall 2 days ago. Pain is along the anterior mid right ribs. EXAM: RIGHT RIBS AND CHEST - 3+ VIEW COMPARISON:  10/05/2014 FINDINGS: No fracture or other bone lesions are seen involving the ribs. There is no evidence of pneumothorax or pleural effusion. Both lungs are clear. Heart size and mediastinal contours are within normal limits. IMPRESSION: 1. No acute displaced rib fracture. 2. No pulmonary contusion or pneumothorax. 3. No abnormal fluid collection. Electronically Signed   By: 10/07/2014 M.D.   On: 03/04/2017 21:00    Procedures Procedures (including critical care time)  Medications Ordered in ED Medications  predniSONE (DELTASONE) tablet 60 mg (not administered)     Initial Impression / Assessment and Plan / ED Course  I have reviewed the triage vital signs and the nursing notes.  Pertinent labs & imaging results that were available during my care of the patient were reviewed by me and considered in my medical decision making (see chart for details).      Patient described being allergic to most medications as they all contain polysorbitol except for Alka-Seltzer prednisone.  The only medication she is interested in taking for pain relief, which was prescribed for her.  Also encouraged continued heat therapy.  Plan follow-up with her PCP for any new or worsening symptoms.  Final Clinical Impressions(s) / ED Diagnoses   Final diagnoses:  Chest wall contusion, right, initial encounter    New Prescriptions New Prescriptions   PREDNISONE (DELTASONE) 10 MG TABLET    Take 6 tablets day one, 5 tablets day two, 4 tablets day three, 3 tablets day four, 2 tablets day five, then 1 tablet day six     03/06/2017 03/04/17 2155    2156, MD 03/13/17 [phone]

## 2017-03-04 NOTE — ED Notes (Signed)
Pt alert & oriented x4, stable gait. Patient given discharge instructions, paperwork & prescription(s). Patient  instructed to stop at the registration desk to finish any additional paperwork. Patient verbalized understanding. Pt left department w/ no further questions. 

## 2017-03-04 NOTE — Discharge Instructions (Signed)
Take your next dose of prednisone tomorrow evening. Continue applying a heating pad to your area of injury.  Your xray is negative for rib fractures tonight.

## 2017-03-04 NOTE — ED Triage Notes (Signed)
Pt states she fell a couple of days ago and the right chest hit the night stand.

## 2017-05-23 ENCOUNTER — Other Ambulatory Visit: Payer: Self-pay

## 2017-05-23 ENCOUNTER — Encounter (HOSPITAL_COMMUNITY): Payer: Self-pay | Admitting: *Deleted

## 2017-05-23 ENCOUNTER — Emergency Department (HOSPITAL_COMMUNITY): Payer: Medicare HMO

## 2017-05-23 ENCOUNTER — Emergency Department (HOSPITAL_COMMUNITY)
Admission: EM | Admit: 2017-05-23 | Discharge: 2017-05-23 | Disposition: A | Discharge status: Home / Self Care | Payer: Medicare HMO | Attending: Emergency Medicine | Admitting: Emergency Medicine

## 2017-05-23 DIAGNOSIS — R059 Cough, unspecified: Secondary | ICD-10-CM

## 2017-05-23 DIAGNOSIS — R030 Elevated blood-pressure reading, without diagnosis of hypertension: Secondary | ICD-10-CM | POA: Insufficient documentation

## 2017-05-23 DIAGNOSIS — Z79899 Other long term (current) drug therapy: Secondary | ICD-10-CM | POA: Diagnosis not present

## 2017-05-23 DIAGNOSIS — R05 Cough: Secondary | ICD-10-CM | POA: Diagnosis not present

## 2017-05-23 DIAGNOSIS — J209 Acute bronchitis, unspecified: Secondary | ICD-10-CM | POA: Diagnosis not present

## 2017-05-23 DIAGNOSIS — F17210 Nicotine dependence, cigarettes, uncomplicated: Secondary | ICD-10-CM | POA: Diagnosis not present

## 2017-05-23 DIAGNOSIS — R062 Wheezing: Secondary | ICD-10-CM | POA: Diagnosis present

## 2017-05-23 MED ORDER — ALBUTEROL SULFATE (2.5 MG/3ML) 0.083% IN NEBU
2.5000 mg | INHALATION_SOLUTION | Freq: Once | RESPIRATORY_TRACT | Status: AC
Start: 2017-05-23 — End: 2017-05-23
  Administered 2017-05-23: 2.5 mg via RESPIRATORY_TRACT
  Filled 2017-05-23: qty 3

## 2017-05-23 MED ORDER — ALBUTEROL SULFATE HFA 108 (90 BASE) MCG/ACT IN AERS
2.0000 | INHALATION_SPRAY | RESPIRATORY_TRACT | Status: DC | PRN
  Administered 2017-05-23: 2 via RESPIRATORY_TRACT
  Filled 2017-05-23: qty 6.7

## 2017-05-23 MED ORDER — AZITHROMYCIN 250 MG PO TABS
ORAL_TABLET | ORAL | 0 refills | Status: DC
Start: 2017-05-23 — End: 2018-09-25

## 2017-05-23 MED ORDER — IPRATROPIUM-ALBUTEROL 0.5-2.5 (3) MG/3ML IN SOLN
3.0000 mL | Freq: Once | RESPIRATORY_TRACT | Status: AC
  Administered 2017-05-23: 3 mL via RESPIRATORY_TRACT
  Filled 2017-05-23: qty 3

## 2017-05-23 MED ORDER — PREDNISONE 20 MG PO TABS: ORAL_TABLET | ORAL | 0 refills | Status: DC

## 2017-05-23 NOTE — Discharge Instructions (Signed)
Follow-up with your doctor in 2-3 weeks for your blood pressure.  Get seen sooner if your breathing does not improve.7

## 2017-05-23 NOTE — ED Notes (Signed)
Pt advised that Dr. Estell Harpin not giving her prednisone because of her allergy to solumedrol.  Pt arguing with nurse that prednisone and solumedrol are not the same drug.   Tried to tell pt that prednisone and solumedrol were related drugs.  Pt states to nurse " you don't know what you are talking about"  Dr. Estell Harpin asked to talk to pt.

## 2017-05-23 NOTE — ED Notes (Signed)
Instructions for use of inhaler and spacer given to pt.  PT demonstrated proper use.

## 2017-05-23 NOTE — ED Triage Notes (Signed)
Pt c/o dry congested cough and chest soreness with coughing x 2 weeks. Denies fever.

## 2017-05-23 NOTE — ED Provider Notes (Signed)
[facility] EMERGENCY DEPARTMENT Provider Note   CSN: [id number] Arrival date & time: 05/23/17  1158     History   Chief Complaint Chief Complaint  Patient presents with  . Cough    HPI Akayla Brass is a 56 y.o. female.  Patient complains of cough and wheezing.  Patient has no fever   The history is provided by the patient.  Cough  This is a new problem. The current episode started 2 days ago. The problem occurs constantly. The problem has not changed since onset.The cough is non-productive. There has been no fever. Associated symptoms include wheezing. Pertinent negatives include no chest pain and no headaches.    Past Medical History:  Diagnosis Date  . Back pain   . DJD (degenerative joint disease)   . Fibromyalgia   . Hypoalbuminemia 09/24/2011  . Osteoporosis   . Rheumatoid arthritis(714.0)   . Vitamin B12 deficiency 09/24/2011    Patient Active Problem List   Diagnosis Date Noted  . Vitamin B12 deficiency 09/24/2011  . Hypoalbuminemia 09/24/2011  . 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 Allergic reaction 09/21/2011  . Chronic back pain 09/21/2011  . Hypokalemia 09/21/2011  . Hypoglycemia 09/21/2011  . Anemia 09/21/2011    Past Surgical History:  Procedure Laterality Date  . ABDOMINAL HYSTERECTOMY      OB History    No data available       Home Medications    Prior to Admission medications   Medication Sig Start Date End Date Taking? Authorizing Provider  aspirin-sod bicarb-citric acid (ALKA-SELTZER) 325 MG TBEF tablet Take 325 mg every 6 (six) hours as needed by mouth (cold).   Yes [provider]  cetirizine (ZYRTEC) 10 MG tablet Take 1 tablet daily by mouth.   Yes [provider]  diphenhydrAMINE (BENADRYL) 25 MG tablet Take 25 mg by mouth every 6 (six) hours as needed for itching or allergies.   Yes [provider]  azithromycin (ZITHROMAX Z-PAK) 250 MG tablet 2 po day one, then 1 daily x 4 days 05/23/17   Bethann Berkshire, MD    Family History No family history on file.  Social History Social History   Tobacco Use  . Smoking status: Current Every Day Smoker    Packs/day: 0.25    Types: Cigarettes  . Smokeless tobacco: Never Used  Substance Use Topics  . Alcohol use: No  . Drug use: No     Allergies   Methylprednisolone sodium succinate; Other; and Polysorbate   Review of Systems Review of Systems  Constitutional: Negative for appetite change and fatigue.  HENT: Negative for congestion, ear discharge and sinus pressure.   Eyes: Negative for discharge.  Respiratory: Positive for cough and wheezing.   Cardiovascular: Negative for chest pain.  Gastrointestinal: Negative for abdominal pain and diarrhea.  Genitourinary: Negative for frequency and hematuria.  Musculoskeletal: Negative for back pain.  Skin: Negative for rash.  Neurological: Negative for seizures and headaches.  Psychiatric/Behavioral: Negative for hallucinations.     Physical Exam Updated Vital Signs BP (!) 162/124 (BP Location: Right Arm)   Pulse 75   Temp 99.5 F (37.5 C) (Temporal)   Resp 19   Ht 5\' 5"  (1.651 m)   Wt 67.1 kg (148 lb)   SpO2 96%   BMI 24.63 kg/m   Physical Exam  Constitutional: She is oriented to person, place, and time. She appears well-developed.  HENT:  Head: Normocephalic.  Eyes: Conjunctivae and EOM are normal. No scleral icterus.  Neck:  Neck supple. No thyromegaly present.  Cardiovascular: Normal rate and regular rhythm. Exam reveals no gallop and no friction rub.  No murmur heard. Pulmonary/Chest: No stridor. She has wheezes. She has no rales. She exhibits no tenderness.  Abdominal: She exhibits no distension. There is no tenderness. There is no rebound.  Musculoskeletal: Normal range of motion. She exhibits no edema.  Lymphadenopathy:    She has no cervical adenopathy.  Neurological: She is oriented to person, place, and time. She exhibits normal muscle tone. Coordination  normal.  Skin: No rash noted. No erythema.  Psychiatric: She has a normal mood and affect. Her behavior is normal.     ED Treatments / Results  Labs (all labs ordered are listed, but only abnormal results are displayed) Labs Reviewed - No data to display  EKG  EKG Interpretation None       Radiology Dg Chest 2 View  Result Date: 05/23/2017 CLINICAL DATA:  Left-sided chest pain, nonproductive cough, and wheezing for the past 2 weeks. No known cardiopulmonary history. Current smoker. History of rheumatoid arthritis. EXAM: CHEST  2 VIEW COMPARISON:  Chest x-ray of March 04, 2017 FINDINGS: The lungs are mildly hyperinflated and clear. The heart and mediastinal structures are normal. There is no pleural effusion. The bony thorax exhibits no acute abnormality. IMPRESSION: Mild hyperinflation may be voluntary or may reflect the patient's smoking history. There is no acute cardiopulmonary abnormality. Electronically Signed   By: David  [place] M.D.   On: 05/23/2017 12:30    Procedures Procedures (including critical care time)  Medications Ordered in ED Medications  albuterol (PROVENTIL HFA;VENTOLIN HFA) 108 (90 Base) MCG/ACT inhaler 2 puff (not administered)  ipratropium-albuterol (DUONEB) 0.5-2.5 (3) MG/3ML nebulizer solution 3 mL (3 mLs Nebulization Given 05/23/17 1551)  albuterol (PROVENTIL) (2.5 MG/3ML) 0.083% nebulizer solution 2.5 mg (2.5 mg Nebulization Given 05/23/17 1552)     Initial Impression / Assessment and Plan / ED Course  I have reviewed the triage vital signs and the nursing notes.  Pertinent labs & imaging results that were available during my care of the patient were reviewed by me and considered in my medical decision making (see chart for details).    Patient with bronchitis and bronchospasm along with hypertension.  She states her blood pressure is never high and she had it checked a couple days ago but she is very angry about being here in the emergency  department.  Patient will be sent home with albuterol inhaler and a Z-Pak she is allergic to prednisone.  She will also get her blood pressure checked next couple weeks  Final Clinical Impressions(s) / ED Diagnoses   Final diagnoses:  Cough    ED Discharge Orders        Ordered    azithromycin (ZITHROMAX Z-PAK) 250 MG tablet     05/23/17 1641       Bethann Berkshire, MD 05/23/17 1647

## 2017-05-23 NOTE — ED Notes (Signed)
Spoke with Toniann Fail in Respiratory to due a breathing treatment on pt.

## 2017-11-29 ENCOUNTER — Other Ambulatory Visit: Payer: Self-pay

## 2017-11-29 ENCOUNTER — Encounter (HOSPITAL_COMMUNITY): Payer: Self-pay | Admitting: Emergency Medicine

## 2017-11-29 ENCOUNTER — Emergency Department (HOSPITAL_COMMUNITY): Payer: Medicare HMO

## 2017-11-29 ENCOUNTER — Emergency Department (HOSPITAL_COMMUNITY)
Admission: EM | Admit: 2017-11-29 | Discharge: 2017-11-30 | Disposition: A | Discharge status: Home / Self Care | Payer: Medicare HMO | Attending: Emergency Medicine | Admitting: Emergency Medicine

## 2017-11-29 DIAGNOSIS — R0789 Other chest pain: Secondary | ICD-10-CM | POA: Insufficient documentation

## 2017-11-29 DIAGNOSIS — R079 Chest pain, unspecified: Secondary | ICD-10-CM | POA: Diagnosis present

## 2017-11-29 DIAGNOSIS — Z79899 Other long term (current) drug therapy: Secondary | ICD-10-CM | POA: Insufficient documentation

## 2017-11-29 DIAGNOSIS — F17210 Nicotine dependence, cigarettes, uncomplicated: Secondary | ICD-10-CM | POA: Diagnosis not present

## 2017-11-29 LAB — I-STAT TROPONIN, ED: TROPONIN I, POC: 0 ng/mL (ref 0.00–0.08)

## 2017-11-29 LAB — I-STAT BETA HCG BLOOD, ED (MC, WL, AP ONLY)

## 2017-11-29 NOTE — ED Triage Notes (Signed)
Patient reports chest pain that started 2 hours ago. Patient states the pain radiated to her back. Denies n/v, diaphoresis, dizziness or SOB.

## 2017-11-29 NOTE — ED Provider Notes (Signed)
[facility] EMERGENCY DEPARTMENT Provider Note   CSN: [id number] Arrival date & time: 11/29/17  2140     History   Chief Complaint Chief Complaint  Patient presents with  . Chest Pain    HPI Melissa Berg is a 57 y.o. female.  Patient is a 57 year old female with past medical history of rheumatoid arthritis, fibromyalgia, degenerative joint disease, and back pain.  She presents today for evaluation of chest discomfort.  She was at the grocery store this evening when she developed the sudden onset of severe pain in the center of her chest that radiated to her left shoulder.  This pain began at approximately 6 PM and was sharp in nature, and not associated with any shortness of breath, diaphoresis, nausea.  This episode lasted approximately 15 minutes, then resolved.  When she got home she checked her blood pressure and it was elevated.  She denies any prior cardiac history, but does state that she had a stress test in [place] approximately 1 year ago that was normal.  She denies any recent exertional symptoms.  She smokes cigarettes, but has no other cardiac risk factors.  The history is provided by the patient.  Chest Pain   This is a new problem. Episode onset: 6 PM. The problem occurs constantly. The problem has been resolved. The pain is present in the substernal region. The pain is severe. The quality of the pain is described as sharp. The pain does not radiate. Pertinent negatives include no cough, no nausea and no shortness of breath. She has tried nothing for the symptoms. There are no known risk factors.    Past Medical History:  Diagnosis Date  . Back pain   . DJD (degenerative joint disease)   . Fibromyalgia   . Hypoalbuminemia 09/24/2011  . Osteoporosis   . Rheumatoid arthritis(714.0)   . Vitamin B12 deficiency 09/24/2011    Patient Active Problem List   Diagnosis Date Noted  . Vitamin B12 deficiency 09/24/2011  . Hypoalbuminemia 09/24/2011  . Allergic angioedema  09/21/2011  . Allergic reaction 09/21/2011  . Chronic back pain 09/21/2011  . Hypokalemia 09/21/2011  . Hypoglycemia 09/21/2011  . Anemia 09/21/2011    Past Surgical History:  Procedure Laterality Date  . ABDOMINAL HYSTERECTOMY       OB History    Gravida  1   Para  1   Term  1   Preterm      AB      Living        SAB      TAB      Ectopic      Multiple      Live Births               Home Medications    Prior to Admission medications   Medication Sig Start Date End Date Taking? Authorizing Provider  aspirin-sod bicarb-citric acid (ALKA-SELTZER) 325 MG TBEF tablet Take 325 mg every 6 (six) hours as needed by mouth (cold).    [provider]  azithromycin (ZITHROMAX Z-PAK) 250 MG tablet 2 po day one, then 1 daily x 4 days 05/23/17   Bethann Berkshire, MD  cetirizine (ZYRTEC) 10 MG tablet Take 1 tablet daily by mouth.    [provider]  diphenhydrAMINE (BENADRYL) 25 MG tablet Take 25 mg by mouth every 6 (six) hours as needed for itching or allergies.    [provider]  predniSONE (DELTASONE) 20 MG tablet 2 tabs po daily x 3 days  05/23/17   Bethann Berkshire, MD    Family History History reviewed. No pertinent family history.  Social History Social History   Tobacco Use  . Smoking status: Current Every Day Smoker    Packs/day: 0.25    Types: Cigarettes  . Smokeless tobacco: Never Used  Substance Use Topics  . Alcohol use: No  . Drug use: No     Allergies   Methylprednisolone sodium succinate; Other; and Polysorbate   Review of Systems Review of Systems  Respiratory: Negative for cough and shortness of breath.   Cardiovascular: Positive for chest pain.  Gastrointestinal: Negative for nausea.  All other systems reviewed and are negative.    Physical Exam Updated Vital Signs BP 122/88 (BP Location: Right Arm)   Pulse 93   Temp 98.8 F (37.1 C) (Oral)   Resp 18   Ht 5\' 5"  (1.651 m)   Wt 68.9 kg (152 lb)   SpO2  99%   BMI 25.29 kg/m   Physical Exam  Constitutional: She is oriented to person, place, and time. She appears well-developed and well-nourished. No distress.  HENT:  Head: Normocephalic and atraumatic.  Neck: Normal range of motion. Neck supple.  Cardiovascular: Normal rate and regular rhythm. Exam reveals no gallop and no friction rub.  No murmur heard. Pulmonary/Chest: Effort normal and breath sounds normal. No respiratory distress. She has no wheezes.  Abdominal: Soft. Bowel sounds are normal. She exhibits no distension. There is no tenderness.  Musculoskeletal: Normal range of motion.       Right lower leg: Normal. She exhibits no tenderness and no edema.       Left lower leg: Normal. She exhibits no tenderness and no edema.  Neurological: She is alert and oriented to person, place, and time.  Skin: Skin is warm and dry. She is not diaphoretic.  Nursing note and vitals reviewed.    ED Treatments / Results  Labs (all labs ordered are listed, but only abnormal results are displayed) Labs Reviewed  BASIC METABOLIC PANEL  CBC  TROPONIN I  I-STAT TROPONIN, ED  I-STAT BETA HCG BLOOD, ED ([facility], [facility], [facility] ONLY)    EKG EKG Interpretation  Date/Time:  Friday Nov 29 2017 21:49:07 EDT Ventricular Rate:  94 PR Interval:  154 QRS Duration: 66 QT Interval:  372 QTC Calculation: 465 R Axis:   26 Text Interpretation:  Normal sinus rhythm Prolonged QT Abnormal ECG Confirmed by [id number] (Geoffery Lyons) on 11/29/2017 11:42:27 PM   Radiology Dg Chest 2 View  Result Date: 11/29/2017 CLINICAL DATA:  Chest pain starting 2 hours ago and radiating to the back. EXAM: CHEST - 2 VIEW COMPARISON:  05/23/2017 FINDINGS: The heart size and mediastinal contours are within normal limits. Both lungs are clear. The visualized skeletal structures are unremarkable. IMPRESSION: No active cardiopulmonary disease. Electronically Signed   By: 05/25/2017 M.D.   On: 11/29/2017 23:11    Procedures Procedures  (including critical care time)  Medications Ordered in ED Medications - No data to display   Initial Impression / Assessment and Plan / ED Course  I have reviewed the triage vital signs and the nursing notes.  Pertinent labs & imaging results that were available during my care of the patient were reviewed by me and considered in my medical decision making (see chart for details).  Patient presents with complaints of chest discomfort that sounds atypical for cardiac pain.  She reports a sharp pain in the center of her chest that occurred while she  was at the grocery store.  This lasted approximately 15 minutes, then resolved.  Her work-up reveals an unchanged EKG and negative troponin x2.  I feel as though she is appropriate for discharge.  I will have her follow-up with cardiology next week and return to the ER if symptoms worsen or change.  Final Clinical Impressions(s) / ED Diagnoses   Final diagnoses:  None    ED Discharge Orders    None       Geoffery Lyons, MD 11/30/17 [phone]

## 2017-11-30 LAB — BASIC METABOLIC PANEL
ANION GAP: 11 (ref 5–15)
BUN: 17 mg/dL (ref 6–20)
CALCIUM: 9.9 mg/dL (ref 8.9–10.3)
CO2: 26 mmol/L (ref 22–32)
Chloride: 102 mmol/L (ref 101–111)
Creatinine, Ser: 1 mg/dL (ref 0.44–1.00)
GFR calc Af Amer: >60 mL/min (ref >60)
Glucose, Bld: 85 mg/dL (ref 65–99)
POTASSIUM: 3.6 mmol/L (ref 3.5–5.1)
SODIUM: 139 mmol/L (ref 135–145)

## 2017-11-30 LAB — CBC
HEMATOCRIT: 47.5 % — AB (ref 36.0–46.0)
Hemoglobin: 16 g/dL — ABNORMAL HIGH (ref 12.0–15.0)
MCH: 31.6 pg (ref 26.0–34.0)
MCHC: 33.7 g/dL (ref 30.0–36.0)
MCV: 93.7 fL (ref 78.0–100.0)
PLATELETS: 158 10*3/uL (ref 150–400)
RBC: 5.07 MIL/uL (ref 3.87–5.11)
RDW: 12.5 % (ref 11.5–15.5)
WBC: 7.8 10*3/uL (ref 4.0–10.5)

## 2017-11-30 LAB — I-STAT TROPONIN, ED: Troponin i, poc: 0 ng/mL (ref 0.00–0.08)

## 2017-11-30 LAB — TROPONIN I: Troponin I: <0.03 ng/mL (ref <0.03)

## 2017-11-30 MED ORDER — KETOROLAC TROMETHAMINE 30 MG/ML IJ SOLN
30.0000 mg | Freq: Once | INTRAMUSCULAR | Status: AC
  Administered 2017-11-30: 30 mg via INTRAVENOUS
  Filled 2017-11-30: qty 1

## 2017-11-30 NOTE — Discharge Instructions (Signed)
Ibuprofen 600 mg every 6 hours as needed for pain.  Return to the ER if symptoms significantly worsen or change.  The contact information for the cardiology clinic has been provided in this discharge summary for you to call and arrange a follow-up appointment if your symptoms persist.

## 2017-12-24 NOTE — Progress Notes (Signed)
Cardiology Office Note   Date:  12/25/2017   ID:  Melissa Berg, DOB [birth year], MRN [id number]  PCP:  Lorelei Pont, DO  Cardiologist:   Charlton Haws, MD   No chief complaint on file.     History of Present Illness: Melissa Berg is a 57 y.o. female who presents for consultation regarding chest pain. Referred by [facility] ED Dr Geoffery Lyons. Reviewed notes from 11/29/17.  She has a history of chronic pain , back and joint with arthritis and fibromyalgia. Had sudden onset of central chest pain while grocery shopping. Radiated to left shoulder. Pain sharp and lasted about 15 mintues. BP elevated when she got home She is a smoker with no other CRF;s Had a normal stress test by report in [place] about a year ago. Pain resolved spontaneously. In ER r/o troponin negative no acute ECG changes CXR NAD   She gets aggravated easily. She thinks the stress test she had a year ago was "stupid" She does not think her Pain is heart related Got worse after she had allergic reaction and she started having spasms    Past Medical History:  Diagnosis Date  . Back pain   . DJD (degenerative joint disease)   . Fibromyalgia   . Hypoalbuminemia 09/24/2011  . Osteoporosis   . Rheumatoid arthritis(714.0)   . Vitamin B12 deficiency 09/24/2011    Past Surgical History:  Procedure Laterality Date  . ABDOMINAL HYSTERECTOMY       Current Outpatient Medications  Medication Sig Dispense Refill  . aspirin-sod bicarb-citric acid (ALKA-SELTZER) 325 MG TBEF tablet Take 325 mg every 6 (six) hours as needed by mouth (cold).    Marland Kitchen azithromycin (ZITHROMAX Z-PAK) 250 MG tablet 2 po day one, then 1 daily x 4 days 6 tablet 0  . cetirizine (ZYRTEC) 10 MG tablet Take 1 tablet daily by mouth.    . diphenhydrAMINE (BENADRYL) 25 MG tablet Take 25 mg by mouth every 6 (six) hours as needed for itching or allergies.    . predniSONE (DELTASONE) 20 MG tablet 2 tabs po daily x 3 days 6 tablet 0   No current facility-administered  medications for this visit.     Allergies:   Methylprednisolone sodium succinate; Other; and Polysorbate    Social History:  The patient  reports that she has been smoking cigarettes.  She has been smoking about 0.25 packs per day. She has never used smokeless tobacco. She reports that she does not drink alcohol or use drugs.   Family History:  The patient's family history is not on file.    ROS:  Please see the history of present illness.   Otherwise, review of systems are positive for none.   All other systems are reviewed and negative.    PHYSICAL EXAM: VS:  BP 140/87 (BP Location: Right Arm, Patient Position: Sitting, Cuff Size: Normal)   Pulse 98   Ht 5\' 5"  (1.651 m)   Wt 154 lb (69.9 kg)   SpO2 99%   BMI 25.63 kg/m  , BMI Body mass index is 25.63 kg/m. Affect appropriate Healthy:  appears stated age HEENT: normal Neck supple with no adenopathy JVP normal no bruits no thyromegaly Lungs clear with no wheezing and good diaphragmatic motion Heart:  S1/S2 no murmur, no rub, gallop or click PMI normal Abdomen: benighn, BS positve, no tenderness, no AAA no bruit.  No HSM or HJR Distal pulses intact with no bruits No edema Neuro non-focal Skin warm and dry No muscular  weakness    EKG:  SR rate 94 nonspecific ST changes LAE 12/03/17    Recent Labs: 11/29/2017: BUN 17; Creatinine, Ser 1.00; Hemoglobin 16.0; Platelets 158; Potassium 3.6; Sodium 139    Lipid Panel No results found for: CHOL, TRIG, HDL, CHOLHDL, VLDL, LDLCALC, LDLDIRECT    Wt Readings from Last 3 Encounters:  12/25/17 154 lb (69.9 kg)  11/29/17 152 lb (68.9 kg)  05/23/17 148 lb (67.1 kg)      Other studies Reviewed: Additional studies/ records that were reviewed today include: Notes from ER, labs, CXR and ECG .    ASSESSMENT AND PLAN:  1.  Chest Pain:  Atypical r/o in ER normal stress test a year ago No need for further testing  2. Smoking counseled on cessation less than 10 minutes CXR  recent ER visit ok 3. Fibromyalgia: may be related to recent chest pain consider adding cymbalta  4. Allergies:  Seems to have issues with anything that has poly sorbate continue zyrtec   Current medicines are reviewed at length with the patient today.  The patient does not have concerns regarding medicines.  The following changes have been made:  no change  Labs/ tests ordered today include: Lexiscan Myovue   No orders of the defined types were placed in this encounter.    Disposition:   FU with cardiology PRN      Signed, Charlton Haws, MD  12/25/2017 1:05 PM    [facility] [address] [place], [place], [place]  [address] Phone: [phone]; Fax: [phone]

## 2017-12-25 ENCOUNTER — Encounter: Payer: Self-pay | Admitting: Cardiovascular Disease

## 2017-12-25 ENCOUNTER — Ambulatory Visit: Payer: Medicare HMO | Admitting: Cardiovascular Disease

## 2017-12-25 VITALS — BP 140/87 | HR 98 | Ht 65.0 in | Wt 154.0 lb

## 2017-12-25 DIAGNOSIS — R079 Chest pain, unspecified: Secondary | ICD-10-CM

## 2017-12-25 NOTE — Patient Instructions (Signed)
Medication Instructions:  Your physician recommends that you continue on your current medications as directed. Please refer to the Current Medication list given to you today.   Labwork: NONE  Testing/Procedures: NONE  Follow-Up: Your physician recommends that you schedule a follow-up appointment in: AS NEEDED      Any Other Special Instructions Will Be Listed Below (If Applicable).     If you need a refill on your cardiac medications before your next appointment, please call your pharmacy.   

## 2018-03-19 ENCOUNTER — Other Ambulatory Visit (HOSPITAL_COMMUNITY): Payer: Self-pay | Admitting: Nurse Practitioner

## 2018-03-19 DIAGNOSIS — R071 Chest pain on breathing: Secondary | ICD-10-CM

## 2018-03-20 ENCOUNTER — Other Ambulatory Visit (HOSPITAL_COMMUNITY): Payer: Self-pay | Admitting: Nurse Practitioner

## 2018-03-20 DIAGNOSIS — Z1239 Encounter for other screening for malignant neoplasm of breast: Secondary | ICD-10-CM

## 2018-03-25 ENCOUNTER — Ambulatory Visit (HOSPITAL_COMMUNITY)
Admission: RE | Admit: 2018-03-25 | Discharge: 2018-03-25 | Disposition: A | Discharge status: Home / Self Care | Payer: Medicare HMO | Source: Ambulatory Visit | Attending: Nurse Practitioner | Admitting: Nurse Practitioner

## 2018-03-25 DIAGNOSIS — Z720 Tobacco use: Secondary | ICD-10-CM | POA: Diagnosis not present

## 2018-03-25 DIAGNOSIS — I071 Rheumatic tricuspid insufficiency: Secondary | ICD-10-CM | POA: Insufficient documentation

## 2018-03-25 DIAGNOSIS — R071 Chest pain on breathing: Secondary | ICD-10-CM | POA: Insufficient documentation

## 2018-03-25 NOTE — Progress Notes (Signed)
*  PRELIMINARY RESULTS* Echocardiogram 2D Echocardiogram has been performed.  Stacey Drain 03/25/2018, 9:09 AM

## 2018-03-28 ENCOUNTER — Ambulatory Visit (HOSPITAL_COMMUNITY)
Admission: RE | Admit: 2018-03-28 | Discharge: 2018-03-28 | Disposition: A | Discharge status: Home / Self Care | Payer: Medicare HMO | Source: Ambulatory Visit | Attending: Nurse Practitioner | Admitting: Nurse Practitioner

## 2018-03-28 DIAGNOSIS — Z1231 Encounter for screening mammogram for malignant neoplasm of breast: Secondary | ICD-10-CM | POA: Insufficient documentation

## 2018-03-28 DIAGNOSIS — Z1239 Encounter for other screening for malignant neoplasm of breast: Secondary | ICD-10-CM

## 2018-04-30 DIAGNOSIS — G8929 Other chronic pain: Secondary | ICD-10-CM | POA: Insufficient documentation

## 2018-04-30 DIAGNOSIS — R079 Chest pain, unspecified: Principal | ICD-10-CM

## 2018-04-30 DIAGNOSIS — Z720 Tobacco use: Secondary | ICD-10-CM | POA: Insufficient documentation

## 2018-04-30 NOTE — Progress Notes (Signed)
Cardiology Office Note    Date:  05/05/2018   ID:  Melissa Berg, DOB [birth year], MRN [id number]  PCP:  Emelda Fear, DO  Cardiologist: Jenkins Rouge, MD EPS: None  No chief complaint on file.   History of Present Illness:  Melissa Berg is a 57 y.o. female with history of chronic chest pain, fibromyalgia, tobacco abuse.  Saw Dr. Johnsie Cancel 12/2017 after an emergency room visit for chest pain and troponins were negative.  Lexiscan Myoview was ordered but never done.  2D echo 03/25/2018 normal LVEF 50 to 55% with grade 1 DD, mild LVH.  Patient complains of muscle spasm and cramps in her chest and back that take her breath away. Occurs at any time. Also chest soreness that she can't touch her chest or wear a bra. Under a lot of stress. No regular exercise. Smokes 1/2 pack/day.BP up today and she says it's because we ask too many questions. Takes her BP at home and it's usually 120/70.  She gets upset a lot and says it goes up then.      Past Medical History:  Diagnosis Date  . Back pain   . DJD (degenerative joint disease)   . Fibromyalgia   . Hypoalbuminemia 09/24/2011  . Osteoporosis   . Rheumatoid arthritis(714.0)   . Vitamin B12 deficiency 09/24/2011    Past Surgical History:  Procedure Laterality Date  . ABDOMINAL HYSTERECTOMY      Current Medications: Current Meds  Medication Sig  . azithromycin (ZITHROMAX Z-PAK) 250 MG tablet 2 po day one, then 1 daily x 4 days  . cetirizine (ZYRTEC) 10 MG tablet Take 1 tablet daily by mouth.  . diphenhydrAMINE (BENADRYL) 25 MG tablet Take 25 mg by mouth every 6 (six) hours as needed for itching or allergies.  . hydrOXYzine (VISTARIL) 25 MG capsule hydroxyzine pamoate 25 mg capsule  Take 1 capsule 4 times a day by oral route.  . predniSONE (DELTASONE) 20 MG tablet 2 tabs po daily x 3 days  . ranitidine (ZANTAC) 300 MG tablet ranitidine 300 mg tablet  Take 1 tablet every day by oral route.     Allergies:   Methylprednisolone sodium  succinate; Other; and Polysorbate   Social History   Socioeconomic History  . Marital status: Single    Spouse name: Not on file  . Number of children: Not on file  . Years of education: Not on file  . Highest education level: Not on file  Occupational History  . Not on file  Social Needs  . Financial resource strain: Not on file  . Food insecurity:    Worry: Not on file    Inability: Not on file  . Transportation needs:    Medical: Not on file    Non-medical: Not on file  Tobacco Use  . Smoking status: Current Every Day Smoker    Packs/day: 0.25    Types: Cigarettes  . Smokeless tobacco: Never Used  Substance and Sexual Activity  . Alcohol use: No  . Drug use: No  . Sexual activity: Yes    Birth control/protection: Surgical  Lifestyle  . Physical activity:    Days per week: Not on file    Minutes per session: Not on file  . Stress: Not on file  Relationships  . Social connections:    Talks on phone: Not on file    Gets together: Not on file    Attends religious service: Not on file    Active member of club or  organization: Not on file    Attends meetings of clubs or organizations: Not on file    Relationship status: Not on file  Other Topics Concern  . Not on file  Social History Narrative  . Not on file     Family History:  The patient's family history includes Arthritis in her father and mother.   ROS:   Please see the history of present illness.    ROS All other systems reviewed and are negative.   PHYSICAL EXAM:   VS:  BP (!) 150/96   Pulse 76   Ht 5' 4.5" (1.638 m)   Wt 148 lb 12.8 oz (67.5 kg)   SpO2 97%   BMI 25.15 kg/m   Physical Exam  GEN: Well nourished, well developed, in no acute distress  Neck: no JVD, carotid bruits, or masses Cardiac:RRR; no murmurs, rubs, or gallops  Respiratory: Decreased breath sounds throughout GI: soft, nontender, nondistended, + BS Ext: without cyanosis, clubbing, or edema, Good distal pulses  bilaterally Neuro:  Alert and Oriented x 3 Psych: euthymic mood, full affect  Wt Readings from Last 3 Encounters:  05/05/18 148 lb 12.8 oz (67.5 kg)  12/25/17 154 lb (69.9 kg)  11/29/17 152 lb (68.9 kg)      Studies/Labs Reviewed:   EKG:  EKG is not ordered today.   Recent Labs: 11/29/2017: BUN 17; Creatinine, Ser 1.00; Hemoglobin 16.0; Platelets 158; Potassium 3.6; Sodium 139   Lipid Panel No results found for: CHOL, TRIG, HDL, CHOLHDL, VLDL, LDLCALC, LDLDIRECT  Additional studies/ records that were reviewed today include:  2D echo 03/25/2018  Study Conclusions   - Left ventricle: The cavity size was normal. Wall thickness was   increased in a pattern of mild LVH. Systolic function was normal.   The estimated ejection fraction was in the range of 50% to 55%.   Doppler parameters are consistent with abnormal left ventricular   relaxation (grade 1 diastolic dysfunction). - Right atrium: The atrium was mildly dilated.     ASSESSMENT:    1. Chronic chest pain   2. Essential hypertension   3. Tobacco abuse      PLAN:  In order of problems listed above:   Chronic chest pain evaluated by Dr. Johnsie Cancel 12/2017 [facility]XT year ago in [place] was normal according to the patient.  Patient continues to have atypical chest pain which I wonder if it is related to her fibromyalgia.  Does not want to do another stress test but wants something done.  Will order coronary CTA and calcium score.  She is allergic to polysorbate wants to make sure that it is not in any of the injections.  She has been approved for CT scans in the past.  We will also need be met. Metoprolol has polysorbate as well as all other beta-blockers.  Dr. Johnsie Cancel recommends exercise Myoview instead of CT.  Will make sure Myoview does not contain polysorbate.  Hypertension seems to be situational when she gets upset.  Will not take any medication.  Tobacco abuse smoking cessation recommended.   Medication Adjustments/Labs and  Tests Ordered: Current medicines are reviewed at length with the patient today.  Concerns regarding medicines are outlined above.  Medication changes, Labs and Tests ordered today are listed in the Patient Instructions below. There are no Patient Instructions on file for this visit.   Sumner Boast, PA-C  05/05/2018 1:06 PM    [facility] [place], [place], [place]  [address]  Phone: [phone]; Fax: [phone]

## 2018-05-05 ENCOUNTER — Ambulatory Visit (INDEPENDENT_AMBULATORY_CARE_PROVIDER_SITE_OTHER): Payer: Medicare HMO | Admitting: Physician Assistant

## 2018-05-05 ENCOUNTER — Encounter: Payer: Self-pay | Admitting: Physician Assistant

## 2018-05-05 DIAGNOSIS — Z720 Tobacco use: Secondary | ICD-10-CM | POA: Diagnosis not present

## 2018-05-05 DIAGNOSIS — G8929 Other chronic pain: Secondary | ICD-10-CM | POA: Diagnosis not present

## 2018-05-05 DIAGNOSIS — R079 Chest pain, unspecified: Secondary | ICD-10-CM

## 2018-05-05 DIAGNOSIS — I10 Essential (primary) hypertension: Secondary | ICD-10-CM | POA: Diagnosis not present

## 2018-05-05 NOTE — Addendum Note (Signed)
Addended by: Kerney Elbe on: 05/05/2018 03:47 PM   Modules accepted: Orders

## 2018-05-05 NOTE — Patient Instructions (Signed)
Medication Instructions:  Your physician recommends that you continue on your current medications as directed. Please refer to the Current Medication list given to you today.  If you need a refill on your cardiac medications before your next appointment, please call your pharmacy.   Lab work: Your physician recommends that you return for lab work in: One week before your CT   If you have labs (blood work) drawn today and your tests are completely normal, you will receive your results only by: Marland Kitchen MyChart Message (if you have MyChart) OR . A paper copy in the mail If you have any lab test that is abnormal or we need to change your treatment, we will call you to review the results.  Testing/Procedures: Coronary CTA  Follow-Up: At [facility], you and your health needs are our priority.  As part of our continuing mission to provide you with exceptional heart care, we have created designated Provider Care Teams.  These Care Teams include your primary Cardiologist (physician) and Advanced Practice Providers (APPs -  Physician Assistants and Nurse Practitioners) who all work together to provide you with the care you need, when you need it. You will need a follow up appointment in next Available. Please call our office 2 months in advance to schedule this appointment.  You may see Charlton Haws, MD or one of the following Advanced Practice Providers on your designated Care Team:   Randall An, PA-C [facility]) . Jacolyn Reedy, PA-C [facility])  Any Other Special Instructions Will Be Listed Below (If Applicable).  Please arrive at the [facility] main entrance of [facility] at xx:xx AM (30-45 minutes prior to test start time)  [facility] [address] [place], [place] [address] [phone]  Proceed to the [facility] Radiology Department (First Floor).  Please follow these instructions carefully (unless otherwise directed):  Hold all erectile  dysfunction medications at least 48 hours prior to test.  On the Night Before the Test: . Be sure to Drink plenty of water. . Do not consume any caffeinated/decaffeinated beverages or chocolate 12 hours prior to your test. . Do not take any antihistamines 12 hours prior to your test. . If you take Metformin do not take 24 hours prior to test. . If the patient has contrast allergy: ? Patient will need a prescription for Prednisone and very clear instructions (as follows): 1. Prednisone 50 mg - take 13 hours prior to test 2. Take another Prednisone 50 mg 7 hours prior to test 3. Take another Prednisone 50 mg 1 hour prior to test 4. Take Benadryl 50 mg 1 hour prior to test . Patient must complete all four doses of above prophylactic medications. . Patient will need a ride after test due to Benadryl.  On the Day of the Test: . Drink plenty of water. Do not drink any water within one hour of the test. . Do not eat any food 4 hours prior to the test. . You may take your regular medications prior to the test.  . Take metoprolol (Lopressor) two hours prior to test. . HOLD Furosemide/Hydrochlorothiazide morning of the test.   *For Clinical Staff only. Please instruct patient the following:*        -Drink plenty of water       -Hold Furosemide/hydrochlorothiazide morning of the test       -Take metoprolol (Lopressor) 2 hours prior to test (if applicable).                  -  If HR is less than 55 BPM- No Beta Blocker                -IF HR is greater than 55 BPM and patient is less than or equal to 39 yrs old Lopressor 100mg  x1.                -If HR is greater than 55 BPM and patient is greater than 17 yrs old Lopressor 50 mg x1.     Do not give Lopressor to patients with an allergy to lopressor or anyone with asthma or active COPD symptoms (currently taking steroids).       After the Test: . Drink plenty of water. . After receiving IV contrast, you may experience a mild flushed feeling.  This is normal. . On occasion, you may experience a mild rash up to 24 hours after the test. This is not dangerous. If this occurs, you can take Benadryl 25 mg and increase your fluid intake. . If you experience trouble breathing, this can be serious. If it is severe call 911 IMMEDIATELY. If it is mild, please call our office. . If you take any of these medications: Glipizide/Metformin, Avandament, Glucavance, please do not take 48 hours after completing test.

## 2018-05-22 ENCOUNTER — Telehealth: Payer: Self-pay | Admitting: Cardiovascular Disease

## 2018-05-22 NOTE — Telephone Encounter (Signed)
Please give pt a call-- wanting to know if she still needs to have the lab work done since she didn't have the CT

## 2018-05-27 ENCOUNTER — Encounter (HOSPITAL_COMMUNITY)
Admission: RE | Admit: 2018-05-27 | Discharge: 2018-05-27 | Disposition: A | Discharge status: Home / Self Care | Payer: Medicare HMO | Source: Ambulatory Visit | Attending: Physician Assistant | Admitting: Physician Assistant

## 2018-05-27 DIAGNOSIS — R079 Chest pain, unspecified: Secondary | ICD-10-CM | POA: Insufficient documentation

## 2018-05-27 LAB — NM MYOCAR MULTI W/SPECT W/WALL MOTION / EF
CHL CUP MPHR: 162 {beats}/min
CHL CUP NUCLEAR SDS: 4
CHL CUP NUCLEAR SSS: 6
CSEPED: 5 min
CSEPHR: 91 %
CSEPPHR: 148 {beats}/min
Estimated workload: 7 METS
Exercise duration (sec): 45 s
LV dias vol: 77 mL (ref 46–106)
LV sys vol: 36 mL
RATE: 0.39
RPE: 13
Rest HR: 62 {beats}/min
SRS: 2
TID: 0.97

## 2018-05-27 MED ORDER — TECHNETIUM TC 99M TETROFOSMIN IV KIT
10.0000 | PACK | Freq: Once | INTRAVENOUS | Status: AC | PRN
  Administered 2018-05-27: 9 via INTRAVENOUS

## 2018-05-27 MED ORDER — TECHNETIUM TC 99M TETROFOSMIN IV KIT
30.0000 | PACK | Freq: Once | INTRAVENOUS | Status: AC | PRN
  Administered 2018-05-27: 30 via INTRAVENOUS

## 2018-05-27 MED ORDER — REGADENOSON 0.4 MG/5ML IV SOLN
INTRAVENOUS | Status: AC
  Filled 2018-05-27: qty 5

## 2018-05-27 MED ORDER — SODIUM CHLORIDE 0.9% FLUSH
INTRAVENOUS | Status: AC
  Administered 2018-05-27: 10 mL via INTRAVENOUS
  Filled 2018-05-27: qty 10

## 2018-05-27 NOTE — Telephone Encounter (Signed)
Mailbox full - pt had lexiscan today and doesn't need labs

## 2018-05-30 NOTE — Progress Notes (Signed)
Cardiology Office Note    Date:  05/30/2018   ID:  Melissa Berg, DOB [birth year], MRN [id number]  PCP:  Lorelei Pont, DO  Cardiologist: Charlton Haws, MD EPS: None  No chief complaint on file.   History of Present Illness:  Melissa Berg is a 57 y.o. female with history of chronic chest pain, fibromyalgia, tobacco abuse.  Saw me  12/2017 after an emergency room visit for chest pain and troponins were negative.  Lexiscan Myoview was ordered but never done.  2D echo 03/25/2018 normal LVEF 50 to 55% with grade 1 DD, mild LVH.  Patient complains of muscle spasm and cramps in her chest and back that take her breath away. Occurs at any time. Also chest soreness that she can't touch her chest or wear a bra. Under a lot of stress. No regular exercise. Smokes 1/2 pack/day.BP up today and she says it's because we ask too many questions. Takes her BP at home and it's usually 120/70.  She gets upset a lot and says it goes up then.  Had f/u myovue 05/27/18 that was normal no ischemia EF 55-60%    Past Medical History:  Diagnosis Date  . Back pain   . DJD (degenerative joint disease)   . Fibromyalgia   . Hypoalbuminemia 09/24/2011  . Osteoporosis   . Rheumatoid arthritis(714.0)   . Vitamin B12 deficiency 09/24/2011    Past Surgical History:  Procedure Laterality Date  . ABDOMINAL HYSTERECTOMY      Current Medications: No outpatient medications have been marked as taking for the 06/03/18 encounter (Appointment) with Wendall Stade, MD.     Allergies:   Methylprednisolone sodium succinate; Other; and Polysorbate   Social History   Socioeconomic History  . Marital status: Single    Spouse name: Not on file  . Number of children: Not on file  . Years of education: Not on file  . Highest education level: Not on file  Occupational History  . Not on file  Social Needs  . Financial resource strain: Not on file  . Food insecurity:    Worry: Not on file    Inability: Not on file    . Transportation needs:    Medical: Not on file    Non-medical: Not on file  Tobacco Use  . Smoking status: Current Every Day Smoker    Packs/day: 0.25    Types: Cigarettes  . Smokeless tobacco: Never Used  Substance and Sexual Activity  . Alcohol use: No  . Drug use: No  . Sexual activity: Yes    Birth control/protection: Surgical  Lifestyle  . Physical activity:    Days per week: Not on file    Minutes per session: Not on file  . Stress: Not on file  Relationships  . Social connections:    Talks on phone: Not on file    Gets together: Not on file    Attends religious service: Not on file    Active member of club or organization: Not on file    Attends meetings of clubs or organizations: Not on file    Relationship status: Not on file  Other Topics Concern  . Not on file  Social History Narrative  . Not on file     Family History:  The patient's family history includes Arthritis in her father and mother.   ROS:   Please see the history of present illness.    ROS All other systems reviewed and are negative.   PHYSICAL  EXAM:   VS:  There were no vitals taken for this visit.  Affect appropriate Healthy:  appears stated age HEENT: normal Neck supple with no adenopathy JVP normal no bruits no thyromegaly Lungs clear with no wheezing and good diaphragmatic motion Heart:  S1/S2 no murmur, no rub, gallop or click PMI normal Abdomen: benighn, BS positve, no tenderness, no AAA no bruit.  No HSM or HJR Distal pulses intact with no bruits No edema Neuro non-focal Skin warm and dry No muscular weakness   Wt Readings from Last 3 Encounters:  05/05/18 148 lb 12.8 oz (67.5 kg)  12/25/17 154 lb (69.9 kg)  11/29/17 152 lb (68.9 kg)      Studies/Labs Reviewed:   EKG:   05/05/18 SR rate 59 normal    Recent Labs: 11/29/2017: BUN 17; Creatinine, Ser 1.00; Hemoglobin 16.0; Platelets 158; Potassium 3.6; Sodium 139   Lipid Panel No results found for: CHOL, TRIG,  HDL, CHOLHDL, VLDL, LDLCALC, LDLDIRECT  Additional studies/ records that were reviewed today include:  2D echo 03/25/2018  Study Conclusions   - Left ventricle: The cavity size was normal. Wall thickness was   increased in a pattern of mild LVH. Systolic function was normal.   The estimated ejection fraction was in the range of 50% to 55%.   Doppler parameters are consistent with abnormal left ventricular   relaxation (grade 1 diastolic dysfunction). - Right atrium: The atrium was mildly dilated.     ASSESSMENT:    No diagnosis found.   PLAN:  In order of problems listed above:   Chest Pain: Chronic normal ETT June 2019 in [place]. She is allergic to polysorbate and could not Have cardiac CT as beta blockers contain this. Myovue done 05/27/18 normal no ischemia EF 55-60% No further w/u needed will see in clinic PRN Encouraged her to f/u with her rheumatologist in [facility] For her fibromyalgia and rheumatoid    HTN:  Well controlled.  Prefers not to have medicine DASH type diet   Tobacco abuse smoking cessation recommended.   Medication Adjustments/Labs and Tests Ordered: Current medicines are reviewed at length with the patient today.  Concerns regarding medicines are outlined above.  Medication changes, Labs and Tests ordered today are listed in the Patient Instructions below. There are no Patient Instructions on file for this visit.   Signed, Charlton Haws, MD  05/30/2018 1:49 PM    [facility] [address] [place], [place], [place]  [address] Phone: [phone]; Fax: [phone]

## 2018-06-03 ENCOUNTER — Ambulatory Visit (INDEPENDENT_AMBULATORY_CARE_PROVIDER_SITE_OTHER): Payer: Medicare HMO | Admitting: Cardiovascular Disease

## 2018-06-03 ENCOUNTER — Encounter: Payer: Self-pay | Admitting: Cardiovascular Disease

## 2018-06-03 VITALS — BP 122/74 | HR 75 | Ht 64.0 in | Wt 148.0 lb

## 2018-06-03 DIAGNOSIS — R079 Chest pain, unspecified: Secondary | ICD-10-CM

## 2018-06-03 NOTE — Patient Instructions (Signed)
Medication Instructions:  Your physician recommends that you continue on your current medications as directed. Please refer to the Current Medication list given to you today.   Labwork: NONE  Testing/Procedures: NONE  Follow-Up: Your physician recommends that you schedule a follow-up appointment in: AS NEEDED      Any Other Special Instructions Will Be Listed Below (If Applicable).     If you need a refill on your cardiac medications before your next appointment, please call your pharmacy.   

## 2018-09-04 ENCOUNTER — Encounter (HOSPITAL_COMMUNITY): Payer: Self-pay | Admitting: Emergency Medicine

## 2018-09-04 ENCOUNTER — Emergency Department (HOSPITAL_COMMUNITY)
Admission: EM | Admit: 2018-09-04 | Discharge: 2018-09-04 | Disposition: A | Discharge status: Home / Self Care | Payer: Medicare HMO | Attending: Emergency Medicine | Admitting: Emergency Medicine

## 2018-09-04 ENCOUNTER — Other Ambulatory Visit: Payer: Self-pay

## 2018-09-04 DIAGNOSIS — M25511 Pain in right shoulder: Secondary | ICD-10-CM | POA: Insufficient documentation

## 2018-09-04 DIAGNOSIS — Z79899 Other long term (current) drug therapy: Secondary | ICD-10-CM | POA: Insufficient documentation

## 2018-09-04 DIAGNOSIS — F17210 Nicotine dependence, cigarettes, uncomplicated: Secondary | ICD-10-CM | POA: Diagnosis not present

## 2018-09-04 DIAGNOSIS — I10 Essential (primary) hypertension: Secondary | ICD-10-CM | POA: Diagnosis not present

## 2018-09-04 DIAGNOSIS — M436 Torticollis: Secondary | ICD-10-CM

## 2018-09-04 DIAGNOSIS — M542 Cervicalgia: Secondary | ICD-10-CM | POA: Diagnosis present

## 2018-09-04 MED ORDER — PREDNISONE 20 MG PO TABS: 40.0000 mg | ORAL_TABLET | Freq: Every day | ORAL | 0 refills | Status: DC

## 2018-09-04 NOTE — Discharge Instructions (Signed)
Apply warm heat on and off to your shoulder and neck.  Take the prednisone as directed until its finished.  Follow-up with your primary doctor

## 2018-09-04 NOTE — ED Triage Notes (Signed)
RT side of neck and RT shoulder pain and stiffness x 2 weeks.  Denies any injuries .  Reports hx of fibromyalgia

## 2018-09-06 NOTE — ED Provider Notes (Signed)
[facility]ERGENCY DEPARTMENT Provider Note   CSN: [id number] Arrival date & time: 09/04/18  1020    History   Chief Complaint Chief Complaint  Patient presents with  . Torticollis    HPI Melissa Berg is a 58 y.o. female.     HPI   Melissa Berg is a 58 y.o. female who presents to the Emergency Department complaining of recurrent pain to the right side of her neck and down her right shoulder.  Symptoms have been present for 2 weeks.  She reports a history of fibromyalgia and recurrent neck and shoulder pain.  She states her current symptoms feel similar to previous episodes.  She denies recent injury.  Pain does not radiate into her arm or hand.  She denies any headache, visual changes, numbness or weakness of her upper extremities, chest pain and shortness of breath.  Pain is worse with movement of her neck and raising her right arm.  She states when she has these symptoms the only thing that helps is prednisone.    Past Medical History:  Diagnosis Date  . Back pain   . DJD (degenerative joint disease)   . Fibromyalgia   . Hypoalbuminemia 09/24/2011  . Osteoporosis   . Rheumatoid arthritis(714.0)   . Vitamin B12 deficiency 09/24/2011    Patient Active Problem List   Diagnosis Date Noted  . Essential hypertension 05/05/2018  . Chronic chest pain 04/30/2018  . Tobacco abuse 04/30/2018  . Vitamin B12 deficiency 09/24/2011  . Hypoalbuminemia 09/24/2011  . 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 Allergic reaction 09/21/2011  . Chronic back pain 09/21/2011  . Hypokalemia 09/21/2011  . Hypoglycemia 09/21/2011  . Anemia 09/21/2011    Past Surgical History:  Procedure Laterality Date  . ABDOMINAL HYSTERECTOMY       OB History    Gravida  1   Para  1   Term  1   Preterm      AB      Living        SAB      TAB      Ectopic      Multiple      Live Births               Home Medications    Prior to Admission medications   Medication Sig Start  Date End Date Taking? Authorizing Provider  aspirin-sod bicarb-citric acid (ALKA-SELTZER) 325 MG TBEF tablet Take 325 mg every 6 (six) hours as needed by mouth (cold).    [provider]  azithromycin (ZITHROMAX Z-PAK) 250 MG tablet 2 po day one, then 1 daily x 4 days 05/23/17   Bethann BerkshireZammit, Joseph, MD  cetirizine (ZYRTEC) 10 MG tablet Take 1 tablet daily by mouth.    [provider]  diphenhydrAMINE (BENADRYL) 25 MG tablet Take 25 mg by mouth every 6 (six) hours as needed for itching or allergies.    [provider]  hydrOXYzine (VISTARIL) 25 MG capsule hydroxyzine pamoate 25 mg capsule  Take 1 capsule 4 times a day by oral route.    [provider]  predniSONE (DELTASONE) 20 MG tablet Take 2 tablets (40 mg total) by mouth daily. For 3 days 09/04/18   Anh Mangano, PA-C  ranitidine (ZANTAC) 300 MG tablet ranitidine 300 mg tablet  Take 1 tablet every day by oral route.    [provider]    Family History Family History  Problem Relation Age of Onset  . Arthritis Mother   .  Arthritis Father     Social History Social History   Tobacco Use  . Smoking status: Current Every Day Smoker    Packs/day: 0.25    Types: Cigarettes  . Smokeless tobacco: Never Used  Substance Use Topics  . Alcohol use: No  . Drug use: No     Allergies   Methylprednisolone sodium succinate; Other; and Polysorbate   Review of Systems Review of Systems  Constitutional: Negative for chills and fever.  Respiratory: Negative for shortness of breath.   Cardiovascular: Negative for chest pain.  Gastrointestinal: Negative for abdominal pain, nausea and vomiting.  Genitourinary: Negative for decreased urine volume.  Musculoskeletal: Positive for arthralgias (right shoulder pain) and neck pain. Negative for back pain and joint swelling.  Skin: Negative for color change and wound.  Neurological: Negative for dizziness, weakness, numbness and headaches.    Psychiatric/Behavioral: Negative for confusion.     Physical Exam Updated Vital Signs BP (!) 152/91 (BP Location: Left Arm)   Pulse 76   Temp 97.7 F (36.5 C) (Oral)   Resp 20   Ht 5\' 4"  (1.626 m)   Wt 67.1 kg   SpO2 100%   BMI 25.40 kg/m   Physical Exam Vitals signs and nursing note reviewed.  Constitutional:      General: She is not in acute distress.    Appearance: Normal appearance. She is not ill-appearing.  HENT:     Head: Atraumatic.     Right Ear: Tympanic membrane and ear canal normal.     Left Ear: Tympanic membrane and ear canal normal.     Mouth/Throat:     Mouth: Mucous membranes are moist.     Pharynx: Oropharynx is clear. No posterior oropharyngeal erythema.  Neck:     Musculoskeletal: Normal range of motion.  Cardiovascular:     Rate and Rhythm: Normal rate and regular rhythm.     Pulses: Normal pulses.  Pulmonary:     Effort: Pulmonary effort is normal. No respiratory distress.     Breath sounds: Normal breath sounds. No wheezing.  Musculoskeletal:        General: Tenderness present. No swelling or deformity.     Comments: Diffuse tenderness all along the right cervical paraspinal muscle, rhomboid muscle and trapezius muscles.  No bony tenderness.  Grip strength is strong and symmetrical bilaterally.  Sensation of the distal extremities is intact  Skin:    General: Skin is warm.     Capillary Refill: Capillary refill takes less than 2 seconds.     Findings: No rash.  Neurological:     General: No focal deficit present.     Mental Status: She is alert. Mental status is at baseline.     GCS: GCS eye subscore is 4. GCS verbal subscore is 5. GCS motor subscore is 6.     Sensory: Sensation is intact. No sensory deficit.     Motor: No weakness.     Coordination: Coordination is intact. Coordination normal.     Gait: Gait is intact.     Comments: CN II-XII intact.  Speech clear      ED Treatments / Results  Labs (all labs ordered are listed, but  only abnormal results are displayed) Labs Reviewed - No data to display  EKG None  Radiology No results found.  Procedures Procedures (including critical care time)  Medications Ordered in ED Medications - No data to display   Initial Impression / Assessment and Plan / ED Course  I have  reviewed the triage vital signs and the nursing notes.  Pertinent labs & imaging results that were available during my care of the patient were reviewed by me and considered in my medical decision making (see chart for details).        Patient with tenderness of the right cervical paraspinal muscles.  Neurovascularly intact.  No focal neurological deficits on exam.  This is a recurrent problem for her.  I feel is likely musculoskeletal.  Doubt acute neurological process.  Patient appears appropriate for discharge home and agrees to close outpatient follow-up.  Return precautions were discussed.  Final Clinical Impressions(s) / ED Diagnoses   Final diagnoses:  Right torticollis    ED Discharge Orders         Ordered    predniSONE (DELTASONE) 20 MG tablet  Daily     09/04/18 1302           Pauline Aus, PA-C 09/06/18 1728    Blane Ohara, MD 09/07/18 [phone]

## 2018-09-17 ENCOUNTER — Other Ambulatory Visit: Payer: Self-pay

## 2018-09-17 ENCOUNTER — Ambulatory Visit (HOSPITAL_COMMUNITY)
Admission: RE | Admit: 2018-09-17 | Discharge: 2018-09-17 | Disposition: A | Discharge status: Home / Self Care | Payer: Medicare HMO | Source: Ambulatory Visit | Attending: Nurse Practitioner | Admitting: Nurse Practitioner

## 2018-09-17 ENCOUNTER — Other Ambulatory Visit (HOSPITAL_COMMUNITY): Payer: Self-pay | Admitting: Nurse Practitioner

## 2018-09-17 DIAGNOSIS — M542 Cervicalgia: Secondary | ICD-10-CM

## 2018-09-17 DIAGNOSIS — M5441 Lumbago with sciatica, right side: Secondary | ICD-10-CM

## 2018-09-25 ENCOUNTER — Emergency Department (HOSPITAL_COMMUNITY)
Admission: EM | Admit: 2018-09-25 | Discharge: 2018-09-25 | Disposition: A | Discharge status: Home / Self Care | Payer: Medicare HMO | Attending: Emergency Medicine | Admitting: Emergency Medicine

## 2018-09-25 ENCOUNTER — Encounter (HOSPITAL_COMMUNITY): Payer: Self-pay | Admitting: *Deleted

## 2018-09-25 ENCOUNTER — Other Ambulatory Visit: Payer: Self-pay

## 2018-09-25 ENCOUNTER — Emergency Department (HOSPITAL_COMMUNITY): Payer: Medicare HMO

## 2018-09-25 DIAGNOSIS — F17210 Nicotine dependence, cigarettes, uncomplicated: Secondary | ICD-10-CM | POA: Diagnosis not present

## 2018-09-25 DIAGNOSIS — Z79899 Other long term (current) drug therapy: Secondary | ICD-10-CM | POA: Insufficient documentation

## 2018-09-25 DIAGNOSIS — I10 Essential (primary) hypertension: Secondary | ICD-10-CM | POA: Insufficient documentation

## 2018-09-25 DIAGNOSIS — M25511 Pain in right shoulder: Secondary | ICD-10-CM | POA: Insufficient documentation

## 2018-09-25 LAB — BASIC METABOLIC PANEL
ANION GAP: 6 (ref 5–15)
BUN: 12 mg/dL (ref 6–20)
CHLORIDE: 111 mmol/L (ref 98–111)
CO2: 25 mmol/L (ref 22–32)
Calcium: 8.9 mg/dL (ref 8.9–10.3)
Creatinine, Ser: 0.76 mg/dL (ref 0.44–1.00)
GFR calc Af Amer: >60 mL/min (ref >60)
Glucose, Bld: 94 mg/dL (ref 70–99)
POTASSIUM: 3.5 mmol/L (ref 3.5–5.1)
Sodium: 142 mmol/L (ref 135–145)

## 2018-09-25 LAB — CBC
HEMATOCRIT: 40.9 % (ref 36.0–46.0)
Hemoglobin: 13.5 g/dL (ref 12.0–15.0)
MCH: 31.3 pg (ref 26.0–34.0)
MCHC: 33 g/dL (ref 30.0–36.0)
MCV: 94.7 fL (ref 80.0–100.0)
NRBC: 0 % (ref 0.0–0.2)
Platelets: 145 10*3/uL — ABNORMAL LOW (ref 150–400)
RBC: 4.32 MIL/uL (ref 3.87–5.11)
RDW: 12.1 % (ref 11.5–15.5)
WBC: 5.5 10*3/uL (ref 4.0–10.5)

## 2018-09-25 LAB — TROPONIN I

## 2018-09-25 MED ORDER — PREDNISONE 10 MG (21) PO TBPK: ORAL_TABLET | Freq: Every day | ORAL | 0 refills | Status: DC

## 2018-09-25 NOTE — ED Provider Notes (Signed)
[facility] EMERGENCY DEPARTMENT Provider Note   CSN: [id number] Arrival date & time: 09/25/18  1741    History   Chief Complaint Chief Complaint  Patient presents with  . Chest Pain    HPI Melissa Berg is a 58 y.o. female.  She is complaining of right shoulder pain into her chest and her shoulder blade that is been going on for many months.  It is causing her intermittent tingling in her fingertips.  It hurts with any movement or deep breath.  No fevers no chills.  She has a chronic cough.  She is seeing cardiologist for this and they do not have any evidence that it is ischemic.  She has a rheumatologist in [place] that she does not like and she is tried to get another rheumatologist in [place].  She does not take any medications for this because she has an allergy to polysorbate that is a component of all medications.  She wants to know what is the cause of her pain     The history is provided by the patient.  Chest Pain  Pain location:  R chest Pain quality: sharp and stabbing   Pain radiates to:  R shoulder Pain severity:  Severe Onset quality:  Gradual Timing:  Constant Progression:  Unchanged Context: breathing, lifting and movement   Worsened by:  Coughing, deep breathing, exertion and movement Ineffective treatments:  None tried Associated symptoms: cough   Associated symptoms: no abdominal pain, no fever, no headache, no nausea, no shortness of breath and no vomiting     Past Medical History:  Diagnosis Date  . Back pain   . DJD (degenerative joint disease)   . Fibromyalgia   . Hypoalbuminemia 09/24/2011  . Osteoporosis   . Rheumatoid arthritis(714.0)   . Vitamin B12 deficiency 09/24/2011    Patient Active Problem List   Diagnosis Date Noted  . Essential hypertension 05/05/2018  . Chronic chest pain 04/30/2018  . Tobacco abuse 04/30/2018  . Vitamin B12 deficiency 09/24/2011  . Hypoalbuminemia 09/24/2011  . 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  Allergic reaction 09/21/2011  . Chronic back pain 09/21/2011  . Hypokalemia 09/21/2011  . Hypoglycemia 09/21/2011  . Anemia 09/21/2011    Past Surgical History:  Procedure Laterality Date  . ABDOMINAL HYSTERECTOMY       OB History    Gravida  1   Para  1   Term  1   Preterm      AB      Living        SAB      TAB      Ectopic      Multiple      Live Births               Home Medications    Prior to Admission medications   Medication Sig Start Date End Date Taking? Authorizing Provider  aspirin-sod bicarb-citric acid (ALKA-SELTZER) 325 MG TBEF tablet Take 325 mg every 6 (six) hours as needed by mouth (cold).    [provider]  azithromycin (ZITHROMAX Z-PAK) 250 MG tablet 2 po day one, then 1 daily x 4 days 05/23/17   Bethann Berkshire, MD  cetirizine (ZYRTEC) 10 MG tablet Take 1 tablet daily by mouth.    [provider]  diphenhydrAMINE (BENADRYL) 25 MG tablet Take 25 mg by mouth every 6 (six) hours as needed for itching or allergies.    [provider]  hydrOXYzine (VISTARIL) 25 MG capsule hydroxyzine  pamoate 25 mg capsule  Take 1 capsule 4 times a day by oral route.    [provider]  predniSONE (DELTASONE) 20 MG tablet Take 2 tablets (40 mg total) by mouth daily. For 3 days 09/04/18   Triplett, Tammy, PA-C  ranitidine (ZANTAC) 300 MG tablet ranitidine 300 mg tablet  Take 1 tablet every day by oral route.    [provider]    Family History Family History  Problem Relation Age of Onset  . Arthritis Mother   . Arthritis Father     Social History Social History   Tobacco Use  . Smoking status: Current Every Day Smoker    Packs/day: 0.25    Types: Cigarettes  . Smokeless tobacco: Never Used  Substance Use Topics  . Alcohol use: No  . Drug use: No     Allergies   Methylprednisolone sodium succinate; Other; and Polysorbate   Review of Systems Review of Systems  Constitutional: Negative for  fever.  HENT: Negative for sore throat.   Eyes: Negative for visual disturbance.  Respiratory: Positive for cough. Negative for shortness of breath.   Cardiovascular: Positive for chest pain.  Gastrointestinal: Negative for abdominal pain, nausea and vomiting.  Genitourinary: Negative for dysuria.  Musculoskeletal: Positive for neck pain.  Skin: Negative for rash.  Neurological: Negative for headaches.     Physical Exam Updated Vital Signs BP 139/89   Pulse (!) 106   Temp 98 F (36.7 C) (Oral)   Resp 16   Ht 5\' 4"  (1.626 m)   Wt 68 kg   SpO2 98%   BMI 25.75 kg/m   Physical Exam Vitals signs and nursing note reviewed.  Constitutional:      General: She is not in acute distress.    Appearance: She is well-developed.  HENT:     Head: Normocephalic and atraumatic.  Eyes:     Conjunctiva/sclera: Conjunctivae normal.  Neck:     Musculoskeletal: Neck supple.  Cardiovascular:     Rate and Rhythm: Normal rate and regular rhythm.     Heart sounds: No murmur.  Pulmonary:     Effort: Pulmonary effort is normal. No respiratory distress.     Breath sounds: Normal breath sounds.  Abdominal:     Palpations: Abdomen is soft.     Tenderness: There is no abdominal tenderness.  Musculoskeletal:     Right lower leg: She exhibits no tenderness.     Left lower leg: She exhibits no tenderness.     Comments: She has diffuse pain of her right trapezius anterior shoulder and posterior shoulder and to her chest wall.  Skin:    General: Skin is warm and dry.  Neurological:     Mental Status: She is alert.      ED Treatments / Results  Labs (all labs ordered are listed, but only abnormal results are displayed) Labs Reviewed  CBC - Abnormal; Notable for the following components:      Result Value   Platelets 145 (*)    All other components within normal limits  BASIC METABOLIC PANEL  TROPONIN I    EKG EKG Interpretation  Date/Time:  Thursday September 25 2018 18:17:45 EDT  Ventricular Rate:  96 PR Interval:  162 QRS Duration: 64 QT Interval:  370 QTC Calculation: 467 R Axis:   52 Text Interpretation:  Normal sinus rhythm Normal ECG similar to prio5 5/19 Confirmed by Meridee ScoreButler, Emran Molzahn [phone]([id number]) on 09/25/2018 8:10:16 PM   Radiology Dg Chest 2 View  Result Date: 09/25/2018 CLINICAL DATA:  Chest pain EXAM: CHEST - 2 VIEW COMPARISON:  11/29/2017 FINDINGS: The heart size and mediastinal contours are within normal limits. Both lungs are clear. Mild scoliosis thoracic spine. IMPRESSION: No active cardiopulmonary disease. Electronically Signed   By: Marlan Palau M.D.   On: 09/25/2018 18:52    Procedures Procedures (including critical care time)  Medications Ordered in ED Medications - No data to display   Initial Impression / Assessment and Plan / ED Course  I have reviewed the triage vital signs and the nursing notes.  Pertinent labs & imaging results that were available during my care of the patient were reviewed by me and considered in my medical decision making (see chart for details).       Patient very unsatisfied about ED not able to fix her shoulder. I have given her contact info of rheumatology in [place] as she does not like her doctor in [place].   Final Clinical Impressions(s) / ED Diagnoses   Final diagnoses:  Acute pain of right shoulder    ED Discharge Orders    None       Terrilee Files, MD 09/26/18 1416

## 2018-09-25 NOTE — Discharge Instructions (Addendum)
You were seen in the emergency department for continued pain through the right side of your neck shoulder into your chest and upper back.  There is no evidence of any cardiac injury and your chest x-ray was normal.  Will be important for you to follow-up with rheumatology and we are giving you the number for a clinic in [place].  We are prescribing you some prednisone which may help limit the inflammation.

## 2018-09-25 NOTE — ED Triage Notes (Signed)
Pt c/o right sided chest pain, right shoulder and back of neck pain x 3 months. Pt also c/o numbness to tips of thumb, pointer finger and middle finger of right hand x 3 days. Pt reports she has been here several times over the last couple of months with the same symptoms but nothing can be found wrong.

## 2019-11-29 ENCOUNTER — Other Ambulatory Visit: Payer: Self-pay

## 2019-11-29 ENCOUNTER — Emergency Department (HOSPITAL_COMMUNITY)
Admission: EM | Admit: 2019-11-29 | Discharge: 2019-11-30 | Discharge status: Against Medical Advice | Payer: Medicare HMO | Attending: Emergency Medicine | Admitting: Emergency Medicine

## 2019-11-29 ENCOUNTER — Encounter (HOSPITAL_COMMUNITY): Payer: Self-pay | Admitting: Emergency Medicine

## 2019-11-29 DIAGNOSIS — R0789 Other chest pain: Secondary | ICD-10-CM | POA: Diagnosis not present

## 2019-11-29 DIAGNOSIS — R030 Elevated blood-pressure reading, without diagnosis of hypertension: Secondary | ICD-10-CM | POA: Diagnosis not present

## 2019-11-29 DIAGNOSIS — M069 Rheumatoid arthritis, unspecified: Secondary | ICD-10-CM | POA: Insufficient documentation

## 2019-11-29 DIAGNOSIS — R05 Cough: Secondary | ICD-10-CM | POA: Diagnosis not present

## 2019-11-29 DIAGNOSIS — N179 Acute kidney failure, unspecified: Secondary | ICD-10-CM | POA: Diagnosis not present

## 2019-11-29 DIAGNOSIS — F17210 Nicotine dependence, cigarettes, uncomplicated: Secondary | ICD-10-CM | POA: Diagnosis not present

## 2019-11-29 DIAGNOSIS — R1013 Epigastric pain: Secondary | ICD-10-CM | POA: Diagnosis present

## 2019-11-29 DIAGNOSIS — R0602 Shortness of breath: Secondary | ICD-10-CM | POA: Insufficient documentation

## 2019-11-29 DIAGNOSIS — I10 Essential (primary) hypertension: Secondary | ICD-10-CM

## 2019-11-29 NOTE — ED Notes (Signed)
Pt refusing iv at this time. Pt states "First of all, you ain't sticking me because I have had allergic reaction to medications."

## 2019-11-29 NOTE — ED Triage Notes (Signed)
Pt c/o epigastric pain and rib pain that radiated to the back started 2 weeks ago. Denies n/v/d.

## 2019-11-30 ENCOUNTER — Emergency Department (HOSPITAL_COMMUNITY): Payer: Medicare HMO

## 2019-11-30 LAB — CBC WITH DIFFERENTIAL/PLATELET
Abs Immature Granulocytes: 0.04 10*3/uL (ref 0.00–0.07)
Basophils Absolute: 0 10*3/uL (ref 0.0–0.1)
Basophils Relative: 0 %
Eosinophils Absolute: 0.1 10*3/uL (ref 0.0–0.5)
Eosinophils Relative: 1 %
HCT: 46.2 % — ABNORMAL HIGH (ref 36.0–46.0)
Hemoglobin: 15.1 g/dL — ABNORMAL HIGH (ref 12.0–15.0)
Immature Granulocytes: 0 %
Lymphocytes Relative: 30 %
Lymphs Abs: 3.5 10*3/uL (ref 0.7–4.0)
MCH: 31.5 pg (ref 26.0–34.0)
MCHC: 32.7 g/dL (ref 30.0–36.0)
MCV: 96.3 fL (ref 80.0–100.0)
Monocytes Absolute: 0.7 10*3/uL (ref 0.1–1.0)
Monocytes Relative: 6 %
Neutro Abs: 7.6 10*3/uL (ref 1.7–7.7)
Neutrophils Relative %: 63 %
Platelets: 149 10*3/uL — ABNORMAL LOW (ref 150–400)
RBC: 4.8 MIL/uL (ref 3.87–5.11)
RDW: 11.8 % (ref 11.5–15.5)
WBC: 12 10*3/uL — ABNORMAL HIGH (ref 4.0–10.5)
nRBC: 0 % (ref 0.0–0.2)

## 2019-11-30 LAB — COMPREHENSIVE METABOLIC PANEL
ALT: 16 U/L (ref 0–44)
AST: 15 U/L (ref 15–41)
Albumin: 3.7 g/dL (ref 3.5–5.0)
Alkaline Phosphatase: 55 U/L (ref 38–126)
Anion gap: 11 (ref 5–15)
BUN: 28 mg/dL — ABNORMAL HIGH (ref 6–20)
CO2: 25 mmol/L (ref 22–32)
Calcium: 9.3 mg/dL (ref 8.9–10.3)
Chloride: 103 mmol/L (ref 98–111)
Creatinine, Ser: 1.35 mg/dL — ABNORMAL HIGH (ref 0.44–1.00)
GFR calc Af Amer: 49 mL/min — ABNORMAL LOW (ref >60)
GFR calc non Af Amer: 43 mL/min — ABNORMAL LOW (ref >60)
Glucose, Bld: 91 mg/dL (ref 70–99)
Potassium: 3.5 mmol/L (ref 3.5–5.1)
Sodium: 139 mmol/L (ref 135–145)
Total Bilirubin: 1.1 mg/dL (ref 0.3–1.2)
Total Protein: 6.5 g/dL (ref 6.5–8.1)

## 2019-11-30 LAB — TROPONIN I (HIGH SENSITIVITY)
Troponin I (High Sensitivity): 4 ng/L (ref <18)
Troponin I (High Sensitivity): 4 ng/L (ref <18)

## 2019-11-30 LAB — MAGNESIUM: Magnesium: 1.9 mg/dL (ref 1.7–2.4)

## 2019-11-30 LAB — CK: Total CK: 123 U/L (ref 38–234)

## 2019-11-30 MED ORDER — IBUPROFEN 400 MG PO TABS
600.0000 mg | ORAL_TABLET | Freq: Once | ORAL | Status: DC
  Filled 2019-11-30: qty 2

## 2019-11-30 MED ORDER — SODIUM CHLORIDE 0.9 % IV BOLUS
500.0000 mL | Freq: Once | INTRAVENOUS | Status: AC
  Administered 2019-11-30: 500 mL via INTRAVENOUS

## 2019-11-30 MED ORDER — SODIUM CHLORIDE 0.9 % IV BOLUS
1000.0000 mL | Freq: Once | INTRAVENOUS | Status: AC
  Administered 2019-11-30: 1000 mL via INTRAVENOUS

## 2019-11-30 NOTE — ED Provider Notes (Signed)
[facility] EMERGENCY DEPARTMENT Provider Note   CSN: [id number] Arrival date & time: 11/29/19  2210   Time seen 1:30 AM  History Chief Complaint  Patient presents with  . Abdominal Pain    Melissa Berg is a 59 y.o. female.  HPI   Patient states she has fibromyalgia and she takes no medications on a regular basis.  She states for the past few weeks she has had a cramping pain and she indicates her lower rib cage bilaterally that radiates around into her back.  She also complains of leg cramps that move around from spot to spot.  She states it got worse 21st and 22nd.  She states she is only short of breath when the pain hits her in the epigastric area.  She states that it is sharp.  She states nothing she does makes it hurt more, sometimes changing positions helps.  She denies nausea, vomiting, or diarrhea or constipation.  She also states she has had a cough off and on for a few days without fever or shortness of breath although she does feel short of breath when the pain gets worse.  She also states her left ankle has been painful and swollen but denies any ankle injury.  Patient denies history of gout.  She denies any injury.  She states she had similar pain over a year ago and was seen by cardiology and they told her it was not her heart.  Patient has tried no medications.  PCP Lorelei Pont, DO   Past Medical History:  Diagnosis Date  . Back pain   . DJD (degenerative joint disease)   . Fibromyalgia   . Hypoalbuminemia 09/24/2011  . Osteoporosis   . Rheumatoid arthritis(714.0)   . Vitamin B12 deficiency 09/24/2011    Patient Active Problem List   Diagnosis Date Noted  . Essential hypertension 05/05/2018  . Chronic chest pain 04/30/2018  . Tobacco abuse 04/30/2018  . Vitamin B12 deficiency 09/24/2011  . Hypoalbuminemia 09/24/2011  . 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 Allergic reaction 09/21/2011  . Chronic back pain 09/21/2011  . Hypokalemia 09/21/2011  . Hypoglycemia  09/21/2011  . Anemia 09/21/2011    Past Surgical History:  Procedure Laterality Date  . ABDOMINAL HYSTERECTOMY       OB History    Gravida  1   Para  1   Term  1   Preterm      AB      Living        SAB      TAB      Ectopic      Multiple      Live Births              Family History  Problem Relation Age of Onset  . Arthritis Mother   . Arthritis Father     Social History   Tobacco Use  . Smoking status: Current Every Day Smoker    Packs/day: 0.25    Types: Cigarettes  . Smokeless tobacco: Never Used  Substance Use Topics  . Alcohol use: No  . Drug use: No    Home Medications Prior to Admission medications   Medication Sig Start Date End Date Taking? Authorizing Provider  predniSONE (STERAPRED UNI-PAK 21 TAB) 10 MG (21) TBPK tablet Take by mouth daily. Take 6 tabs by mouth daily  for 2 days, then 5 tabs for 2 days, then 4 tabs for 2 days, then 3 tabs for 2 days, 2 tabs for  2 days, then 1 tab by mouth daily for 2 days 09/25/18   Terrilee Files, MD    Allergies    Methylprednisolone sodium succinate, Other, and Polysorbate  Review of Systems   Review of Systems  All other systems reviewed and are negative.   Physical Exam Updated Vital Signs BP (!) 155/113 (BP Location: Right Arm)   Pulse 99   Temp 98 F (36.7 C) (Oral)   Resp 19   Ht 5\' 4"  (1.626 m)   Wt 68 kg   SpO2 97%   BMI 25.75 kg/m   Vital signs normal except for hypertension   Physical Exam Vitals and nursing note reviewed.  Constitutional:      Appearance: Normal appearance. She is normal weight.  HENT:     Head: Normocephalic and atraumatic.     Right Ear: External ear normal.     Left Ear: External ear normal.     Nose: Nose normal.     Mouth/Throat:     Mouth: Mucous membranes are moist.     Pharynx: No oropharyngeal exudate or posterior oropharyngeal erythema.  Eyes:     Extraocular Movements: Extraocular movements intact.     Conjunctiva/sclera:  Conjunctivae normal.     Pupils: Pupils are equal, round, and reactive to light.  Cardiovascular:     Rate and Rhythm: Normal rate and regular rhythm.     Pulses: Normal pulses.  Pulmonary:     Effort: Pulmonary effort is normal. No respiratory distress.     Breath sounds: Normal breath sounds.  Chest:     Chest wall: Tenderness present.       Comments: Patient states her chest hurts even when I placed a stethoscope on it to listen to her heart Abdominal:     General: Abdomen is flat. Bowel sounds are normal.     Palpations: Abdomen is soft.     Tenderness: There is no abdominal tenderness.  Musculoskeletal:        General: Normal range of motion.     Cervical back: Normal range of motion and neck supple.  Skin:    General: Skin is warm and dry.     Findings: No rash.  Neurological:     General: No focal deficit present.     Mental Status: She is alert and oriented to person, place, and time.     Cranial Nerves: No cranial nerve deficit.  Psychiatric:        Mood and Affect: Mood normal.        Behavior: Behavior normal.        Thought Content: Thought content normal.     ED Results / Procedures / Treatments   Labs (all labs ordered are listed, but only abnormal results are displayed) Results for orders placed or performed during the hospital encounter of 11/29/19  Comprehensive metabolic panel  Result Value Ref Range   Sodium 139 135 - 145 mmol/L   Potassium 3.5 3.5 - 5.1 mmol/L   Chloride 103 98 - 111 mmol/L   CO2 25 22 - 32 mmol/L   Glucose, Bld 91 70 - 99 mg/dL   BUN 28 (H) 6 - 20 mg/dL   Creatinine, Ser 12/01/19 (H) 0.44 - 1.00 mg/dL   Calcium 9.3 8.9 - 1.69 mg/dL   Total Protein 6.5 6.5 - 8.1 g/dL   Albumin 3.7 3.5 - 5.0 g/dL   AST 15 15 - 41 U/L   ALT 16 0 - 44 U/L   Alkaline  Phosphatase 55 38 - 126 U/L   Total Bilirubin 1.1 0.3 - 1.2 mg/dL   GFR calc non Af Amer 43 (L) >60 mL/min   GFR calc Af Amer 49 (L) >60 mL/min   Anion gap 11 5 - 15  CBC with  Differential  Result Value Ref Range   WBC 12.0 (H) 4.0 - 10.5 K/uL   RBC 4.80 3.87 - 5.11 MIL/uL   Hemoglobin 15.1 (H) 12.0 - 15.0 g/dL   HCT 46.2 (H) 36.0 - 46.0 %   MCV 96.3 80.0 - 100.0 fL   MCH 31.5 26.0 - 34.0 pg   MCHC 32.7 30.0 - 36.0 g/dL   RDW 11.8 11.5 - 15.5 %   Platelets 149 (L) 150 - 400 K/uL   nRBC 0.0 0.0 - 0.2 %   Neutrophils Relative % 63 %   Neutro Abs 7.6 1.7 - 7.7 K/uL   Lymphocytes Relative 30 %   Lymphs Abs 3.5 0.7 - 4.0 K/uL   Monocytes Relative 6 %   Monocytes Absolute 0.7 0.1 - 1.0 K/uL   Eosinophils Relative 1 %   Eosinophils Absolute 0.1 0.0 - 0.5 K/uL   Basophils Relative 0 %   Basophils Absolute 0.0 0.0 - 0.1 K/uL   Immature Granulocytes 0 %   Abs Immature Granulocytes 0.04 0.00 - 0.07 K/uL  Magnesium  Result Value Ref Range   Magnesium 1.9 1.7 - 2.4 mg/dL  CK  Result Value Ref Range   Total CK 123 38 - 234 U/L  Troponin I (High Sensitivity)  Result Value Ref Range   Troponin I (High Sensitivity) 4 <18 ng/L  Troponin I (High Sensitivity)  Result Value Ref Range   Troponin I (High Sensitivity) 4 <18 ng/L   Laboratory interpretation all normal except new renal insufficiency    EKG EKG Interpretation  Date/Time:  Sunday Nov 29 2019 22:25:14 EDT Ventricular Rate:  113 PR Interval:  158 QRS Duration: 66 QT Interval:  334 QTC Calculation: 458 R Axis:   21 Text Interpretation: Sinus tachycardia Nonspecific T wave abnormality Since last tracing rate faster 25 Sep 2018 Confirmed by Rolland Porter [phone]) on 11/30/2019 1:42:42 AM   Radiology DG Chest 2 View  Result Date: 11/30/2019 CLINICAL DATA:  Anterior chest pain with cough. EXAM: CHEST - 2 VIEW COMPARISON:  September 25, 2018 FINDINGS: The heart size and mediastinal contours are within normal limits. Both lungs are clear. The visualized skeletal structures are unremarkable. Aortic calcifications are noted. IMPRESSION: No active cardiopulmonary disease. Electronically Signed   By: Constance Holster M.D.   On: 11/30/2019 03:25    Procedures Procedures (including critical care time)  Medications Ordered in ED Medications  ibuprofen (ADVIL) tablet 600 mg (600 mg Oral Refused 11/30/19 0209)  sodium chloride 0.9 % bolus 1,000 mL (0 mLs Intravenous Stopped 11/30/19 0541)  sodium chloride 0.9 % bolus 500 mL (0 mLs Intravenous Stopped 11/30/19 0604)    ED Course  I have reviewed the triage vital signs and the nursing notes.  Pertinent labs & imaging results that were available during my care of the patient were reviewed by me and considered in my medical decision making (see chart for details).    MDM Rules/Calculators/A&P                      Patient was given ibuprofen for her presumed chest wall pain.  Laboratory testing was done, I will check to make sure she does not have a  electrolyte abnormality such as low magnesium, potassium, or calcium that could be causing her muscle cramping.  She also states she has had a cough for a few days so chest x-ray was done.  4:38 AM I talked to the patient that she now has an acute renal injury, she states she has been drinking fluids well.  Patient's BUN and creatinine in March 2020 was 12 and 0.76.  Her renal function actually has been normal all the way back to 2013.  CK was added to her blood work.  Talked to the patient that we need to put an IV and give her IV fluids and she would probably need to be admitted for further evaluation.  5:55 AM Dr Welton Flakes, hospitalist, will admit  Patient has hypertension in the ED but is not on any hypertensive medications.  6:20 AM despite me talking to patient that she needs to be admitted to the hospital because of her kidneys her husband approached me states she is ready to go.  Patient states she will not be admitted.  She was advised she needed to stay to make sure her kidneys function improved however she states she wants to go.  Patient was advised to follow-up with her primary care doctor soon.    Final Clinical Impression(s) / ED Diagnoses Final diagnoses:  Essential hypertension  Acute renal injury (HCC)    Rx / DC Orders ED Discharge Orders    None      Plan admission, pt refused, signed out AMA  Devoria Albe, MD, Concha Pyo, MD 11/30/19 [phone]

## 2019-11-30 NOTE — ED Notes (Signed)
Pt refused to be admitted. EDP made aware. Pt signed AMA

## 2019-11-30 NOTE — ED Notes (Signed)
Attempted to give pt ibuprofen as prescribed. Pt states "I told you earlier I don't take any medicines except poly sorbitol. You can just take that back." Dr. Lynelle Doctor made aware.

## 2019-11-30 NOTE — Discharge Instructions (Addendum)
You need to follow-up with your doctor soon to check your kidney function again.  If you do not get this controlled you could end up needing dialysis.

## 2020-09-01 ENCOUNTER — Other Ambulatory Visit (HOSPITAL_COMMUNITY): Payer: Self-pay | Admitting: Nurse Practitioner

## 2020-09-01 DIAGNOSIS — Z13820 Encounter for screening for osteoporosis: Secondary | ICD-10-CM

## 2020-09-19 ENCOUNTER — Other Ambulatory Visit (HOSPITAL_COMMUNITY): Payer: Self-pay | Admitting: Nurse Practitioner

## 2020-09-19 ENCOUNTER — Ambulatory Visit (HOSPITAL_COMMUNITY)
Admission: RE | Admit: 2020-09-19 | Discharge: 2020-09-19 | Disposition: A | Discharge status: Home / Self Care | Payer: Medicare HMO | Source: Ambulatory Visit | Attending: Nurse Practitioner | Admitting: Nurse Practitioner

## 2020-09-19 ENCOUNTER — Other Ambulatory Visit: Payer: Self-pay

## 2020-09-19 DIAGNOSIS — M79644 Pain in right finger(s): Secondary | ICD-10-CM

## 2020-09-19 DIAGNOSIS — M25561 Pain in right knee: Secondary | ICD-10-CM

## 2020-09-21 ENCOUNTER — Other Ambulatory Visit (HOSPITAL_COMMUNITY): Payer: Self-pay | Admitting: Nurse Practitioner

## 2020-09-21 ENCOUNTER — Other Ambulatory Visit: Payer: Self-pay | Admitting: Nurse Practitioner

## 2020-09-21 DIAGNOSIS — Z87891 Personal history of nicotine dependence: Secondary | ICD-10-CM

## 2020-09-21 DIAGNOSIS — M25561 Pain in right knee: Secondary | ICD-10-CM

## 2020-09-21 DIAGNOSIS — F17210 Nicotine dependence, cigarettes, uncomplicated: Secondary | ICD-10-CM

## 2020-09-21 DIAGNOSIS — Z1231 Encounter for screening mammogram for malignant neoplasm of breast: Secondary | ICD-10-CM

## 2020-09-21 DIAGNOSIS — M79644 Pain in right finger(s): Secondary | ICD-10-CM

## 2020-09-27 ENCOUNTER — Ambulatory Visit (HOSPITAL_COMMUNITY)
Admission: RE | Admit: 2020-09-27 | Discharge: 2020-09-27 | Disposition: A | Discharge status: Home / Self Care | Payer: Medicare HMO | Source: Ambulatory Visit | Attending: Nurse Practitioner | Admitting: Nurse Practitioner

## 2020-09-27 DIAGNOSIS — M810 Age-related osteoporosis without current pathological fracture: Secondary | ICD-10-CM | POA: Diagnosis not present

## 2020-09-27 DIAGNOSIS — Z780 Asymptomatic menopausal state: Secondary | ICD-10-CM | POA: Insufficient documentation

## 2020-09-27 DIAGNOSIS — Z13820 Encounter for screening for osteoporosis: Secondary | ICD-10-CM | POA: Diagnosis present

## 2020-09-27 DIAGNOSIS — M069 Rheumatoid arthritis, unspecified: Secondary | ICD-10-CM | POA: Diagnosis not present

## 2020-10-03 ENCOUNTER — Other Ambulatory Visit: Payer: Self-pay

## 2020-10-03 ENCOUNTER — Ambulatory Visit (HOSPITAL_COMMUNITY)
Admission: RE | Admit: 2020-10-03 | Discharge: 2020-10-03 | Disposition: A | Discharge status: Home / Self Care | Payer: Medicare HMO | Source: Ambulatory Visit | Attending: Nurse Practitioner | Admitting: Nurse Practitioner

## 2020-10-03 DIAGNOSIS — Z87891 Personal history of nicotine dependence: Secondary | ICD-10-CM | POA: Diagnosis present

## 2020-10-03 DIAGNOSIS — F17210 Nicotine dependence, cigarettes, uncomplicated: Secondary | ICD-10-CM | POA: Insufficient documentation

## 2020-10-05 ENCOUNTER — Ambulatory Visit (HOSPITAL_COMMUNITY)
Admission: RE | Admit: 2020-10-05 | Discharge: 2020-10-05 | Disposition: A | Discharge status: Home / Self Care | Payer: Medicare HMO | Source: Ambulatory Visit | Attending: Nurse Practitioner | Admitting: Nurse Practitioner

## 2020-10-05 ENCOUNTER — Other Ambulatory Visit: Payer: Self-pay

## 2020-10-05 DIAGNOSIS — Z1231 Encounter for screening mammogram for malignant neoplasm of breast: Secondary | ICD-10-CM | POA: Insufficient documentation

## 2020-10-11 NOTE — Progress Notes (Signed)
CARDIOLOGY CONSULT NOTE       Patient ID: Melissa Berg MRN: [id number] DOB/AGE: 09-09-1960 60 y.o.  Admit date: (Not on file) Referring Physician: Leary Berg Primary Physician: Melissa Pont, DO Primary Cardiologist: New last seen 2019 Reason for Consultation: CAD  Active Problems:   * No active hospital problems. *   HPI:  60 y.o. referred by Dr Melissa Berg for CAD. Last seen by Cardiology in 2019. History of atypical chest pain , fibromyalgia, smoking Echo 03/25/2018 with EF 50-55% mild LVH. Some white coat HTN BP fine at home Myovue 05/27/18 normal no ischemia EF 55-60% She is allergic to polysorbate and cannot take beta blockers so cardiac CT not option. Lung cancer screening CT 10/03/20  showed emphysema with stable pre vascular node dating back to 2016. Commented on age advance coronary artery atherosclerosis   She does not work She is not interested in quitting smoking Smokes < 1 ppd. She has indigestion /Gerd that radiates to chest on occasion Activity limited by fibromyalgia and right knee arthritis   ROS All other systems reviewed and negative except as noted above  Past Medical History:  Diagnosis Date  . Back pain   . DJD (degenerative joint disease)   . Fibromyalgia   . Hypoalbuminemia 09/24/2011  . Osteoporosis   . Rheumatoid arthritis(714.0)   . Vitamin B12 deficiency 09/24/2011    Family History  Problem Relation Age of Onset  . Arthritis Mother   . Arthritis Father     Social History   Socioeconomic History  . Marital status: Single    Spouse name: Not on file  . Number of children: Not on file  . Years of education: Not on file  . Highest education level: Not on file  Occupational History  . Not on file  Tobacco Use  . Smoking status: Current Every Day Smoker    Packs/day: 0.25    Types: Cigarettes  . Smokeless tobacco: Never Used  Vaping Use  . Vaping Use: Never used  Substance and Sexual Activity  . Alcohol use: No  . Drug use: No  . Sexual  activity: Yes    Birth control/protection: Surgical  Other Topics Concern  . Not on file  Social History Narrative  . Not on file   Social Determinants of Health   Financial Resource Strain: Not on file  Food Insecurity: Not on file  Transportation Needs: Not on file  Physical Activity: Not on file  Stress: Not on file  Social Connections: Not on file  Intimate Partner Violence: Not on file    Past Surgical History:  Procedure Laterality Date  . ABDOMINAL HYSTERECTOMY        Current Outpatient Medications:  .  cetirizine HCl (ZYRTEC) 5 MG/5ML SOLN, Take 5 mg by mouth as needed for allergies., Disp: , Rfl:     Physical Exam: Blood pressure 128/80, pulse 78, height 5' 3.5" (1.613 m), weight 64.6 kg, SpO2 96 %.   Affect appropriate Healthy:  appears stated age HEENT: normal Neck supple with no adenopathy JVP normal no bruits no thyromegaly Lungs clear with no wheezing and good diaphragmatic motion Heart:  S1/S2 no murmur, no rub, gallop or click PMI normal Abdomen: benighn, BS positve, no tenderness, no AAA no bruit.  No HSM or HJR Distal pulses intact with no bruits No edema Neuro non-focal Skin warm and dry No muscular weakness   Labs:   Lab Results  Component Value Date   WBC 12.0 (H) 11/30/2019   HGB 15.1 (  H) 11/30/2019   HCT 46.2 (H) 11/30/2019   MCV 96.3 11/30/2019   PLT 149 (L) 11/30/2019   No results for input(s): NA, K, CL, CO2, BUN, CREATININE, CALCIUM, PROT, BILITOT, ALKPHOS, ALT, AST, GLUCOSE in the last 168 hours.  Invalid input(s): LABALBU Lab Results  Component Value Date   CKTOTAL 123 11/30/2019   TROPONINI <0.03 09/25/2018   No results found for: CHOL No results found for: HDL No results found for: LDLCALC No results found for: TRIG No results found for: CHOLHDL No results found for: LDLDIRECT    Radiology: DG BONE DENSITY (DXA)  Result Date: 09/27/2020 EXAM: DUAL X-RAY ABSORPTIOMETRY (DXA) FOR BONE MINERAL DENSITY IMPRESSION:  Your patient Melissa Berg completed a BMD test on 09/27/2020 using the [employer] DXA System (software version: 14.10) manufactured by [employer]. The following summarizes the results of our evaluation. Technologist: [facility] PATIENT BIOGRAPHICAL: Name: Melissa Berg Patient ID: [id number] Birth Date: July 01, 1961 Height: 63.0 in. Gender: Female Exam Date: 09/27/2020 Weight: 150.0 lbs. Indications: Follow up Osteoporosis, Low Calcium Intake, Partial Hysterectomy, Post Menopausal, Rheumatoid Arthritis, Tobacco User Fractures: Treatments: DENSITOMETRY RESULTS: Site      Region        Measured Date Measured Age WHO Classification Young Adult T-score BMD         %Change vs. Previous Significant Change (*) AP Spine L1-L4 (L2,L3) 09/27/2020 60.9 Osteoporosis -2.5 0.868 g/cm2 - - DualFemur Neck Right 09/27/2020 60.9 Osteopenia -1.7 0.803 g/cm2 - - ASSESSMENT: BMD as determined from AP Spine L1-L4 (L2,L3) is 0.868 g/cm2 with a T-Score of -2.5. This patient is considered osteoporotic according to World Health Organization [facility]) criteria. The scan quality is good. L2 and L3 were excluded due to advanced degenerative changes. Per official position of the ISCD, it is not possible to quantitatively compare BMD or calculate a LSC between different facilities or devices. World Science writer [facility]) criteria for post-menopausal, Caucasian Women: Normal:       T-score at or above -1 SD Osteopenia:   T-score between -1 and -2.5 SD Osteoporosis: T-score at or below -2.5 SD RECOMMENDATIONS: 1. All patients should optimize calcium and vitamin D intake. 2. Consider FDA-approved medical therapies in postmenopausal women and med aged 17 years and older, based on the following: a. A hip or vertebral (clinical or morphometric) fracture b. T-score < -2.5 at the femoral neck or spine after appropriate evaluation to exclude secondary causes c. Low bone mass (T-score between -1.0 and -2.5 at the femoral neck or spine) and a  10-year probability of a hip fracture > 3% or a 10-year probability of a major osteoporosis-related fracture > 20% based on the [place]-adapted WHO algorithm d. Clinician judgment and/or patient preferences may indicate treatment for people with 10-year fracture probabilities above or below these levels FOLLOW-UP: Patients with diagnosis of osteoporosis or at high risk fracture should have regular bone mineral density tests. For patients eligible for Medicare, routine testing is allowed once every 2 years. Testing frequency can be increased to on year for patients who have rapidly progressing disease, those who are receiving medical therapy to restore bone mass, or have additional risk factors. Electronically Signed   By: Danae Orleans M.D.   On: 09/27/2020 13:03   DG Knee Complete 4 Views Right  Result Date: 09/19/2020 CLINICAL DATA:  Knee pain after fall EXAM: RIGHT KNEE - COMPLETE 4+ VIEW COMPARISON:  Femur radiograph 04/29/2014 FINDINGS: No fracture or malalignment. The joint spaces are maintained. Probable trace knee effusion IMPRESSION:  No acute osseous abnormality. Electronically Signed   By: Jasmine Pang M.D.   On: 09/19/2020 23:25   DG Hand Complete Right  Result Date: 09/19/2020 CLINICAL DATA:  Fall with pain EXAM: RIGHT HAND - COMPLETE 3+ VIEW COMPARISON:  None. FINDINGS: No fracture or malalignment. Joint space narrowing and degenerative change at the D IP, first IP and first MCP joints. IMPRESSION: No acute osseous abnormality. Electronically Signed   By: Jasmine Pang M.D.   On: 09/19/2020 23:24   MM 3D SCREEN BREAST BILATERAL  Result Date: 10/06/2020 CLINICAL DATA:  Screening. EXAM: DIGITAL SCREENING BILATERAL MAMMOGRAM WITH TOMOSYNTHESIS AND CAD TECHNIQUE: Bilateral screening digital craniocaudal and mediolateral oblique mammograms were obtained. Bilateral screening digital breast tomosynthesis was performed. The images were evaluated with computer-aided detection. COMPARISON:  Previous  exam(s). ACR Breast Density Category b: There are scattered areas of fibroglandular density. FINDINGS: There are no findings suspicious for malignancy. The images were evaluated with computer-aided detection. IMPRESSION: No mammographic evidence of malignancy. A result letter of this screening mammogram will be mailed directly to the patient. RECOMMENDATION: Screening mammogram in one year. (Code:[id number]-B-01Y) BI-RADS CATEGORY  1: Negative. Electronically Signed   By: Baird Lyons M.D.   On: 10/06/2020 11:24   CT CHEST LUNG CA SCREEN LOW DOSE W/O CM  Result Date: 10/04/2020 CLINICAL DATA:  Current smoker.  Twenty pack-year history. EXAM: CT CHEST WITHOUT CONTRAST LOW-DOSE FOR LUNG CANCER SCREENING TECHNIQUE: Multidetector CT imaging of the chest was performed following the standard protocol without IV contrast. COMPARISON:  11/30/2019 chest radiograph.  CTA chest 10/05/2014 FINDINGS: Cardiovascular: Aortic atherosclerosis. Tortuous thoracic aorta. Normal heart size, without pericardial effusion. Lad coronary artery calcification. Mediastinum/Nodes: No middle mediastinal adenopathy. Hilar regions poorly evaluated without intravenous contrast. Prevascular soft tissue density, likely a mildly enlarged node, measures 1.1 x 1.6 cm on 27/2, similar to 2016. Lungs/Pleura: No pleural fluid. Mild centrilobular emphysema. Calcified and noncalcified pulmonary nodules. The largest noncalcified nodule is in the right upper lobe at volume derived equivalent diameter 4.2 mm. Upper Abdomen: Normal imaged portions of the liver, spleen, stomach, adrenal glands, kidneys. Musculoskeletal: No acute osseous abnormality. Mild convex left thoracic spine curvature. IMPRESSION: 1. Lung-RADS 2, benign appearance or behavior. Continue annual screening with low-dose chest CT without contrast in 12 months. 2. Aortic Atherosclerosis (ICD10-I70.0) and Emphysema (ICD10-J43.9). 3. Age advanced coronary artery atherosclerosis. Recommend assessment of  coronary risk factors and consideration of medical therapy. 4. Enlarged prevascular node is similar back to 2016, consistent with a benign/reactive etiology. Electronically Signed   By: Jeronimo Greaves M.D.   On: 10/04/2020 14:12    EKG: 12/01/19 ST rate 101 nonspecific ST changes    ASSESSMENT AND PLAN:   1. CAD: in smoker subclinical seen on chest CT for lung cancer screening Normal myovue 05/27/18 will update  2. Fibromyalgia:  Allergic reaction to oral meds f/u primary consider infusions  3. Emphysema/COPD:  Counseled on smoking cessation < 10 minutes  F/u lung cancer screening CT due March 2023    Ex Myovue F/U PRN if normal   Signed: Charlton Haws 10/17/2020, 9:48 AM

## 2020-10-14 ENCOUNTER — Other Ambulatory Visit (HOSPITAL_COMMUNITY): Payer: Medicare HMO

## 2020-10-17 ENCOUNTER — Encounter: Payer: Self-pay | Admitting: Cardiovascular Disease

## 2020-10-17 ENCOUNTER — Other Ambulatory Visit: Payer: Self-pay

## 2020-10-17 ENCOUNTER — Ambulatory Visit (INDEPENDENT_AMBULATORY_CARE_PROVIDER_SITE_OTHER): Payer: Medicare HMO | Admitting: Cardiovascular Disease

## 2020-10-17 VITALS — BP 128/80 | HR 78 | Ht 63.5 in | Wt 142.4 lb

## 2020-10-17 DIAGNOSIS — M797 Fibromyalgia: Secondary | ICD-10-CM

## 2020-10-17 DIAGNOSIS — F17200 Nicotine dependence, unspecified, uncomplicated: Secondary | ICD-10-CM | POA: Diagnosis not present

## 2020-10-17 DIAGNOSIS — IMO0001 Reserved for inherently not codable concepts without codable children: Secondary | ICD-10-CM

## 2020-10-17 DIAGNOSIS — I2510 Atherosclerotic heart disease of native coronary artery without angina pectoris: Secondary | ICD-10-CM | POA: Diagnosis not present

## 2020-10-17 DIAGNOSIS — R079 Chest pain, unspecified: Secondary | ICD-10-CM | POA: Diagnosis not present

## 2020-10-17 DIAGNOSIS — G8929 Other chronic pain: Secondary | ICD-10-CM

## 2020-10-17 NOTE — Patient Instructions (Signed)
Medication Instructions:  Your physician recommends that you continue on your current medications as directed. Please refer to the Current Medication list given to you today.  *If you need a refill on your cardiac medications before your next appointment, please call your pharmacy*   Lab Work: None today  If you have labs (blood work) drawn today and your tests are completely normal, you will receive your results only by: MyChart Message (if you have MyChart) OR A paper copy in the mail If you have any lab test that is abnormal or we need to change your treatment, we will call you to review the results.   Testing/Procedures: Your physician has requested that you have en exercise stress myoview. For further information please visit [email or web address]. Please follow instruction sheet, as given.    Follow-Up: At [facility], you and your health needs are our priority.  As part of our continuing mission to provide you with exceptional heart care, we have created designated Provider Care Teams.  These Care Teams include your primary Cardiologist (physician) and Advanced Practice Providers (APPs -  Physician Assistants and Nurse Practitioners) who all work together to provide you with the care you need, when you need it.  We recommend signing up for the patient portal called "MyChart".  Sign up information is provided on this After Visit Summary.  MyChart is used to connect with patients for Virtual Visits (Telemedicine).  Patients are able to view lab/test results, encounter notes, upcoming appointments, etc.  Non-urgent messages can be sent to your provider as well.   To learn more about what you can do with MyChart, go to [email or web address].    Your next appointment:   12 month(s)  The format for your next appointment:   In Person  Provider:   Peter Nishan, MD   Other Instructions None      

## 2020-10-19 ENCOUNTER — Encounter: Payer: Self-pay | Admitting: Radiology

## 2020-10-20 ENCOUNTER — Other Ambulatory Visit (HOSPITAL_COMMUNITY): Payer: Medicare HMO

## 2020-10-24 ENCOUNTER — Other Ambulatory Visit: Payer: Self-pay

## 2020-10-24 ENCOUNTER — Other Ambulatory Visit (HOSPITAL_COMMUNITY): Payer: Medicare HMO

## 2020-10-24 ENCOUNTER — Other Ambulatory Visit (HOSPITAL_COMMUNITY)
Admission: RE | Admit: 2020-10-24 | Discharge: 2020-10-24 | Disposition: A | Discharge status: Home / Self Care | Payer: Medicare HMO | Source: Ambulatory Visit | Attending: Cardiovascular Disease | Admitting: Cardiovascular Disease

## 2020-10-24 ENCOUNTER — Encounter (HOSPITAL_COMMUNITY): Payer: Medicare HMO

## 2020-10-24 DIAGNOSIS — Z01812 Encounter for preprocedural laboratory examination: Secondary | ICD-10-CM | POA: Insufficient documentation

## 2020-10-24 DIAGNOSIS — Z20822 Contact with and (suspected) exposure to covid-19: Secondary | ICD-10-CM | POA: Insufficient documentation

## 2020-10-24 LAB — SARS CORONAVIRUS 2 (TAT 6-24 HRS): SARS Coronavirus 2: NEGATIVE

## 2020-10-26 ENCOUNTER — Ambulatory Visit (HOSPITAL_COMMUNITY)
Admission: RE | Admit: 2020-10-26 | Discharge: 2020-10-26 | Disposition: A | Discharge status: Home / Self Care | Payer: Medicare HMO | Source: Ambulatory Visit | Attending: Cardiovascular Disease | Admitting: Cardiovascular Disease

## 2020-10-26 ENCOUNTER — Telehealth: Payer: Self-pay

## 2020-10-26 ENCOUNTER — Encounter (HOSPITAL_COMMUNITY)
Admission: RE | Admit: 2020-10-26 | Discharge: 2020-10-26 | Disposition: A | Discharge status: Home / Self Care | Payer: Medicare HMO | Source: Ambulatory Visit | Attending: Cardiovascular Disease | Admitting: Cardiovascular Disease

## 2020-10-26 ENCOUNTER — Encounter (HOSPITAL_COMMUNITY): Payer: Self-pay

## 2020-10-26 ENCOUNTER — Other Ambulatory Visit: Payer: Self-pay

## 2020-10-26 DIAGNOSIS — G8929 Other chronic pain: Secondary | ICD-10-CM | POA: Insufficient documentation

## 2020-10-26 DIAGNOSIS — R079 Chest pain, unspecified: Secondary | ICD-10-CM | POA: Diagnosis not present

## 2020-10-26 HISTORY — DX: Systemic involvement of connective tissue, unspecified: M35.9

## 2020-10-26 LAB — NM MYOCAR MULTI W/SPECT W/WALL MOTION / EF
Estimated workload: 7 METS
Exercise duration (min): 6 min
Exercise duration (sec): 20 s
LV dias vol: 86 mL (ref 46–106)
LV sys vol: 32 mL
MPHR: 160 {beats}/min
Peak HR: 137 {beats}/min
Percent HR: 85 %
RATE: 0.32
RPE: 11
Rest HR: 84 {beats}/min
SDS: 1
SRS: 0
SSS: 1
TID: 1.01

## 2020-10-26 MED ORDER — SODIUM CHLORIDE FLUSH 0.9 % IV SOLN
INTRAVENOUS | Status: AC
  Administered 2020-10-26: 10 mL via INTRAVENOUS
  Filled 2020-10-26: qty 10

## 2020-10-26 MED ORDER — REGADENOSON 0.4 MG/5ML IV SOLN
INTRAVENOUS | Status: AC
  Filled 2020-10-26: qty 5

## 2020-10-26 MED ORDER — TECHNETIUM TC 99M TETROFOSMIN IV KIT
30.0000 | PACK | Freq: Once | INTRAVENOUS | Status: AC | PRN
  Administered 2020-10-26: 32 via INTRAVENOUS

## 2020-10-26 MED ORDER — TECHNETIUM TC 99M TETROFOSMIN IV KIT
10.0000 | PACK | Freq: Once | INTRAVENOUS | Status: AC | PRN
  Administered 2020-10-26: 11 via INTRAVENOUS

## 2020-10-26 NOTE — Telephone Encounter (Signed)
-----   Message from Wendall Stade, MD sent at 10/26/2020  1:23 PM EDT ----- Normal myovue study with no evidence of ischemia or infarction

## 2020-10-26 NOTE — Telephone Encounter (Signed)
Patient notified and verbalized understanding. 

## 2020-11-08 ENCOUNTER — Ambulatory Visit: Payer: Medicare HMO | Admitting: Orthopedic Surgery

## 2020-11-09 ENCOUNTER — Ambulatory Visit: Payer: Medicare HMO | Admitting: Orthopedic Surgery

## 2020-11-09 ENCOUNTER — Other Ambulatory Visit: Payer: Self-pay

## 2020-11-09 ENCOUNTER — Encounter: Payer: Self-pay | Admitting: Orthopedic Surgery

## 2020-11-09 VITALS — HR 70 | Ht 63.5 in | Wt 139.6 lb

## 2020-11-09 DIAGNOSIS — M069 Rheumatoid arthritis, unspecified: Secondary | ICD-10-CM

## 2020-11-09 NOTE — Patient Instructions (Signed)
Voltaren topical gel - can rub on the knee    Knee Exercises  Ask your health care provider which exercises are safe for you. Do exercises exactly as told by your health care provider and adjust them as directed. It is normal to feel mild stretching, pulling, tightness, or discomfort as you do these exercises. Stop right away if you feel sudden pain or your pain gets worse. Do not begin these exercises until told by your health care provider.  Stretching and range-of-motion exercises These exercises warm up your muscles and joints and improve the movement and flexibility of your knee. These exercises also help to relieve pain and swelling.  Knee extension, prone 1. Lie on your abdomen (prone position) on a bed. 2. Place your left / right knee just beyond the edge of the surface so your knee is not on the bed. You can put a towel under your left / right thigh just above your kneecap for comfort. 3. Relax your leg muscles and allow gravity to straighten your knee (extension). You should feel a stretch behind your left / right knee. 4. Hold this position for 10 seconds. 5. Scoot up so your knee is supported between repetitions. Repeat 10 times. Complete this exercise 3-4 times per week.     Knee flexion, active 1. Lie on your back with both legs straight. If this causes back discomfort, bend your left / right knee so your foot is flat on the floor. 2. Slowly slide your left / right heel back toward your buttocks. Stop when you feel a gentle stretch in the front of your knee or thigh (flexion). 3. Hold this position for 10 seconds. 4. Slowly slide your left / right heel back to the starting position. Repeat 10 times. Complete this exercise 3-4 times per week.      Quadriceps stretch, prone 1. Lie on your abdomen on a firm surface, such as a bed or padded floor. 2. Bend your left / right knee and hold your ankle. If you cannot reach your ankle or pant leg, loop a belt around your foot and  grab the belt instead. 3. Gently pull your heel toward your buttocks. Your knee should not slide out to the side. You should feel a stretch in the front of your thigh and knee (quadriceps). 4. Hold this position for 10 seconds. Repeat 10 times. Complete this exercise 3-4 times per week.      Hamstring, supine 1. Lie on your back (supine position). 2. Loop a belt or towel over the ball of your left / right foot. The ball of your foot is on the walking surface, right under your toes. 3. Straighten your left / right knee and slowly pull on the belt to raise your leg until you feel a gentle stretch behind your knee (hamstring). ? Do not let your knee bend while you do this. ? Keep your other leg flat on the floor. 4. Hold this position for 10 seconds. Repeat 10 times. Complete this exercise 3-4 times per week.   Strengthening exercises These exercises build strength and endurance in your knee. Endurance is the ability to use your muscles for a long time, even after they get tired.  Quadriceps, isometric This exercise stretches the muscles in front of your thigh (quadriceps) without moving your knee joint (isometric). 1. Lie on your back with your left / right leg extended and your other knee bent. Put a rolled towel or small pillow under your knee if told  by your health care provider. 2. Slowly tense the muscles in the front of your left / right thigh. You should see your kneecap slide up toward your hip or see increased dimpling just above the knee. This motion will push the back of the knee toward the floor. 3. For 10 seconds, hold the muscle as tight as you can without increasing your pain. 4. Relax the muscles slowly and completely. Repeat 10 times. Complete this exercise 3-4 times per week. .     Straight leg raises This exercise stretches the muscles in front of your thigh (quadriceps) and the muscles that move your hips (hip flexors). 1. Lie on your back with your left / right leg  extended and your other knee bent. 2. Tense the muscles in the front of your left / right thigh. You should see your kneecap slide up or see increased dimpling just above the knee. Your thigh may even shake a bit. 3. Keep these muscles tight as you raise your leg 4-6 inches (10-15 cm) off the floor. Do not let your knee bend. 4. Hold this position for 10 seconds. 5. Keep these muscles tense as you lower your leg. 6. Relax your muscles slowly and completely after each repetition. Repeat 10 times. Complete this exercise 3-4 times per week.  Hamstring, isometric 1. Lie on your back on a firm surface. 2. Bend your left / right knee about 30 degrees. 3. Dig your left / right heel into the surface as if you are trying to pull it toward your buttocks. Tighten the muscles in the back of your thighs (hamstring) to "dig" as hard as you can without increasing any pain. 4. Hold this position for 10 seconds. 5. Release the tension gradually and allow your muscles to relax completely for __________ seconds after each repetition. Repeat 10 times. Complete this exercise 3-4 times per week.  Hamstring curls If told by your health care provider, do this exercise while wearing ankle weights. Begin with 5 lb weights. Then increase the weight by 1 lb (0.5 kg) increments. You can also use an exercise band 1. Lie on your abdomen with your legs straight. 2. Bend your left / right knee as far as you can without feeling pain. Keep your hips flat against the floor. 3. Hold this position for 10 seconds. 4. Slowly lower your leg to the starting position. Repeat 10 times. Complete this exercise 3-4 times per week.      Squats This exercise strengthens the muscles in front of your thigh and knee (quadriceps). 1. Stand in front of a table, with your feet and knees pointing straight ahead. You may rest your hands on the table for balance but not for support. 2. Slowly bend your knees and lower your hips like you are  going to sit in a chair. ? Keep your weight over your heels, not over your toes. ? Keep your lower legs upright so they are parallel with the table legs. ? Do not let your hips go lower than your knees. ? Do not bend lower than told by your health care provider. ? If your knee pain increases, do not bend as low. 3. Hold the squat position for 10 seconds. 4. Slowly push with your legs to return to standing. Do not use your hands to pull yourself to standing. Repeat 10 times. Complete this exercise 3-4 times per week .     Wall slides This exercise strengthens the muscles in front of your thigh and  knee (quadriceps). 1. Lean your back against a smooth wall or door, and walk your feet out 18-24 inches (46-61 cm) from it. 2. Place your feet hip-width apart. 3. Slowly slide down the wall or door until your knees bend 90 degrees. Keep your knees over your heels, not over your toes. Keep your knees in line with your hips. 4. Hold this position for 10 seconds. Repeat 10 times. Complete this exercise 3-4 times per week.      Straight leg raises This exercise strengthens the muscles that rotate the leg at the hip and move it away from your body (hip abductors). 1. Lie on your side with your left / right leg in the top position. Lie so your head, shoulder, knee, and hip line up. You may bend your bottom knee to help you keep your balance. 2. Roll your hips slightly forward so your hips are stacked directly over each other and your left / right knee is facing forward. 3. Leading with your heel, lift your top leg 4-6 inches (10-15 cm). You should feel the muscles in your outer hip lifting. ? Do not let your foot drift forward. ? Do not let your knee roll toward the ceiling. 4. Hold this position for 10 seconds. 5. Slowly return your leg to the starting position. 6. Let your muscles relax completely after each repetition. Repeat 10 times. Complete this exercise 3-4 times per week.      Straight  leg raises This exercise stretches the muscles that move your hips away from the front of the pelvis (hip extensors). 1. Lie on your abdomen on a firm surface. You can put a pillow under your hips if that is more comfortable. 2. Tense the muscles in your buttocks and lift your left / right leg about 4-6 inches (10-15 cm). Keep your knee straight as you lift your leg. 3. Hold this position for 10 seconds. 4. Slowly lower your leg to the starting position. 5. Let your leg relax completely after each repetition. Repeat 10 times. Complete this exercise 3-4 times per week.

## 2020-11-10 ENCOUNTER — Encounter: Payer: Self-pay | Admitting: Orthopedic Surgery

## 2020-11-10 NOTE — Progress Notes (Signed)
New Patient Visit  Assessment: Melissa Berg is a 60 y.o. female with the following: Right knee pain, secondary to rheumatoid arthritis  Plan: Reviewed radiographs, and discussed the patient's presenting complaints.  She has no injury to her right knee.  She states she gets plenty of exercise.  The description of her pain is consistent with arthritis.  However, radiographs do not demonstrate significant degenerative changes.  At this point, I think that the rheumatoid arthritis is affecting her right knee.    She has multiple medication allergies, including Solu-Medrol and we have been very reluctant to provide her with a steroid injection in the past.  She is also limited in terms of the medications that she can take.  As a result, I have urged her to schedule an appointment with the allergist that she is states she is supposed to see, to get clarification regarding further treatment.  I cannot provide her with appropriate medications, including injections, due to the concern for adverse reactions to the medicine and associated ingredients.  She stated her understanding.  Follow-up as needed.   Follow-up: Return if symptoms worsen or fail to improve.  Subjective:  Chief Complaint  Patient presents with  . Knee Pain    Patient reports right knee pain, denies any swelling.     History of Present Illness: Melissa Berg is a 60 y.o. female who presents for evaluation of right knee pain.  She has a history of rheumatoid arthritis.  No previous injury to her right knee.  She denies mechanical symptoms.  She does not take any medications for her knee as she has extensive allergies to medicines.  She does not note any swelling in the right knee.  She does not wear a brace.  She states she gets plenty of exercise for her right knee.   Review of Systems: No fevers or chills No numbness or tingling No chest pain No shortness of breath No bowel or bladder dysfunction No GI distress No  headaches   Medical History:  Past Medical History:  Diagnosis Date  . Back pain   . Collagen vascular disease (HCC)   . DJD (degenerative joint disease)   . Fibromyalgia   . Hypoalbuminemia 09/24/2011  . Osteoporosis   . Rheumatoid arthritis(714.0)   . Vitamin B12 deficiency 09/24/2011    Past Surgical History:  Procedure Laterality Date  . ABDOMINAL HYSTERECTOMY      Family History  Problem Relation Age of Onset  . Arthritis Mother   . Arthritis Father    Social History   Tobacco Use  . Smoking status: Current Every Day Smoker    Packs/day: 0.25    Types: Cigarettes  . Smokeless tobacco: Never Used  Vaping Use  . Vaping Use: Never used  Substance Use Topics  . Alcohol use: No  . Drug use: No    Allergies  Allergen Reactions  . Methylprednisolone Sodium Succinate Swelling  . Other Hives, Itching and Swelling    Solumedrol REACTION: Severe facial, tongue, and throat swelling  . Polysorbate Anaphylaxis and Swelling    (80)    Current Meds  Medication Sig  . cetirizine HCl (ZYRTEC) 5 MG/5ML SOLN Take 5 mg by mouth as needed for allergies.    Objective: Pulse 70   Ht 5' 3.5" (1.613 m)   Wt 139 lb 9.6 oz (63.3 kg)   BMI 24.34 kg/m   Physical Exam:  General: Alert and oriented.  No acute distress. Gait: Right-sided antalgic gait.  Evaluation of  the right knee demonstrates no effusion.  Atrophy of the quadriceps, specifically within the VMO.  Diffuse tenderness to palpation throughout the right knee.  Negative McMurray's.  Range of motion from 0 to 120 degrees.  Negative Lachman.  No increased laxity to varus or valgus stress.    IMAGING: I personally ordered and reviewed the following images   X-rays of the right knee demonstrates some mild varus alignment overall.  Maintenance of joint space.  Minimal osteophytes are noted.  No acute injuries.  Impression: Mild right knee arthritis   New Medications:  No orders of the defined types were  placed in this encounter.     Oliver Barre, MD  11/10/2020 10:17 AM

## 2022-01-03 NOTE — Progress Notes (Signed)
CARDIOLOGY CONSULT NOTE       Patient ID: Melissa Berg MRN: [id number] DOB/AGE: 62-Apr-1962 62 y.o.  Admit date: (Not on file) Referring Physician: Leary Roca Primary Physician: Lorelei Pont, DO Primary Cardiologist: Eden Emms Reason for Consultation: CAD  Active Problems:   * No active hospital problems. *   HPI:  61 y.o. referred by Dr Leary Roca for CAD on 10/17/20. Last seen by Cardiology in 2019. History of atypical chest pain , fibromyalgia, smoking Echo 03/25/2018 with EF 50-55% mild LVH. Some white coat HTN BP fine at home Myovue 05/27/18 normal no ischemia EF 55-60% She is allergic to polysorbate and cannot take beta blockers so cardiac CT not option. Lung cancer screening CT 10/03/20  showed emphysema with stable pre vascular node dating back to 2016. Commented on age advance coronary artery atherosclerosis   She does not work She is not interested in quitting smoking Smokes < 1 ppd. She has indigestion /Gerd that radiates to chest on occasion Activity limited by fibromyalgia and right knee arthritis   Myovue done 10/26/20 normal with no ischemia EF 55-60%   Having some right knee pain seen by [facility] on 11/09/20 No injections due to allergies To steroids ??  Only complaints is chronic pain from arthritis/fibromyalgia Getting shots 2x/week for polysorbide Allergy   ROS All other systems reviewed and negative except as noted above  Past Medical History:  Diagnosis Date   Back pain    Collagen vascular disease (HCC)    DJD (degenerative joint disease)    Fibromyalgia    Hypoalbuminemia 09/24/2011   Osteoporosis    Rheumatoid arthritis(714.0)    Vitamin B12 deficiency 09/24/2011    Family History  Problem Relation Age of Onset   Arthritis Mother    Arthritis Father     Social History   Socioeconomic History   Marital status: Single    Spouse name: Not on file   Number of children: Not on file   Years of education: Not on file   Highest education level: Not  on file  Occupational History   Not on file  Tobacco Use   Smoking status: Every Day    Packs/day: 0.25    Types: Cigarettes   Smokeless tobacco: Never  Vaping Use   Vaping Use: Never used  Substance and Sexual Activity   Alcohol use: No   Drug use: No   Sexual activity: Yes    Birth control/protection: Surgical  Other Topics Concern   Not on file  Social History Narrative   Not on file   Social Determinants of Health   Financial Resource Strain: Not on file  Food Insecurity: Not on file  Transportation Needs: Not on file  Physical Activity: Not on file  Stress: Not on file  Social Connections: Not on file  Intimate Partner Violence: Not on file    Past Surgical History:  Procedure Laterality Date   ABDOMINAL HYSTERECTOMY        Current Outpatient Medications:    cetirizine HCl (ZYRTEC) 5 MG/5ML SOLN, Take 5 mg by mouth as needed for allergies., Disp: , Rfl:     Physical Exam: Blood pressure (!) 160/100, pulse 62, height 5\' 3"  (1.6 m), weight 143 lb (64.9 kg), SpO2 97 %.   Affect appropriate Healthy:  appears stated age HEENT: normal Neck supple with no adenopathy JVP normal no bruits no thyromegaly Lungs clear with no wheezing and good diaphragmatic motion Heart:  S1/S2 no murmur, no rub, gallop or click PMI normal  Abdomen: benighn, BS positve, no tenderness, no AAA no bruit.  No HSM or HJR Distal pulses intact with no bruits No edema Neuro non-focal Skin warm and dry No muscular weakness   Labs:   Lab Results  Component Value Date   WBC 12.0 (H) 11/30/2019   HGB 15.1 (H) 11/30/2019   HCT 46.2 (H) 11/30/2019   MCV 96.3 11/30/2019   PLT 149 (L) 11/30/2019   No results for input(s): "NA", "K", "CL", "CO2", "BUN", "CREATININE", "CALCIUM", "PROT", "BILITOT", "ALKPHOS", "ALT", "AST", "GLUCOSE" in the last 168 hours.  Invalid input(s): "LABALBU" Lab Results  Component Value Date   CKTOTAL 123 11/30/2019   TROPONINI <0.03 09/25/2018   No results  found for: "CHOL" No results found for: "HDL" No results found for: "LDLCALC" No results found for: "TRIG" No results found for: "CHOLHDL" No results found for: "LDLDIRECT"    Radiology: No results found.   EKG: 12/01/19 ST rate 101 nonspecific ST changes 01/08/2022 NSR rate 62 normal    ASSESSMENT AND PLAN:   1. CAD: in smoker subclinical seen on chest CT for lung cancer screening Normal myovue 05/27/18 and 10/26/20 observe  2. Fibromyalgia:  Allergic reaction to oral meds f/u primary consider infusions  3. Emphysema/COPD:  Counseled on smoking cessation < 10 minutes  F/u lung cancer screening CT due no cancer on scan 10/04/20 4. BP elevated today she says its fine at home and elevated because she's in pain Does not want to be on any BP meds   Lung cancer CT  F/U in a year  Signed: Charlton Haws 01/08/2022, 9:16 AM

## 2022-01-08 ENCOUNTER — Encounter: Payer: Self-pay | Admitting: Cardiovascular Disease

## 2022-01-08 ENCOUNTER — Ambulatory Visit: Payer: Medicare HMO | Admitting: Cardiovascular Disease

## 2022-01-08 VITALS — BP 160/100 | HR 62 | Ht 63.0 in | Wt 143.0 lb

## 2022-01-08 DIAGNOSIS — F17200 Nicotine dependence, unspecified, uncomplicated: Secondary | ICD-10-CM | POA: Diagnosis not present

## 2022-01-08 DIAGNOSIS — M797 Fibromyalgia: Secondary | ICD-10-CM

## 2022-01-08 DIAGNOSIS — I2510 Atherosclerotic heart disease of native coronary artery without angina pectoris: Secondary | ICD-10-CM

## 2022-01-08 IMAGING — MG MM DIGITAL SCREENING BILAT W/ TOMO AND CAD
8 series · 8 of 24 positions shown · non-contrast
Comparison: Previous exam(s).

CLINICAL DATA: Screening.

EXAM:
DIGITAL SCREENING BILATERAL MAMMOGRAM WITH TOMOSYNTHESIS AND CAD
TECHNIQUE: Bilateral screening digital craniocaudal and mediolateral oblique
mammograms were obtained. Bilateral screening digital breast
tomosynthesis was performed. The images were evaluated with
computer-aided detection.

[R MLO synth-2D]
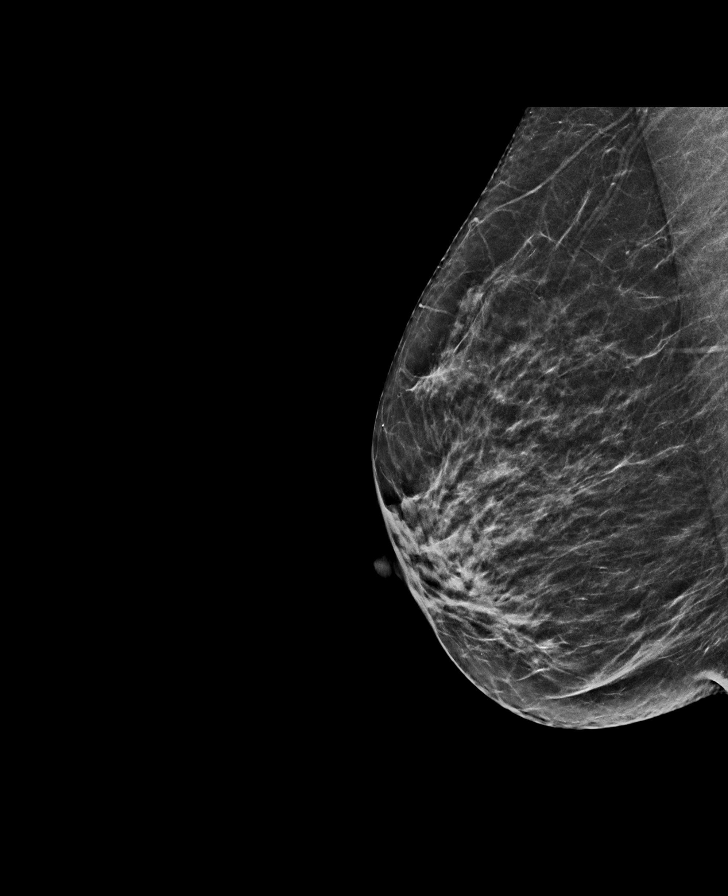

[L CC synth-2D]
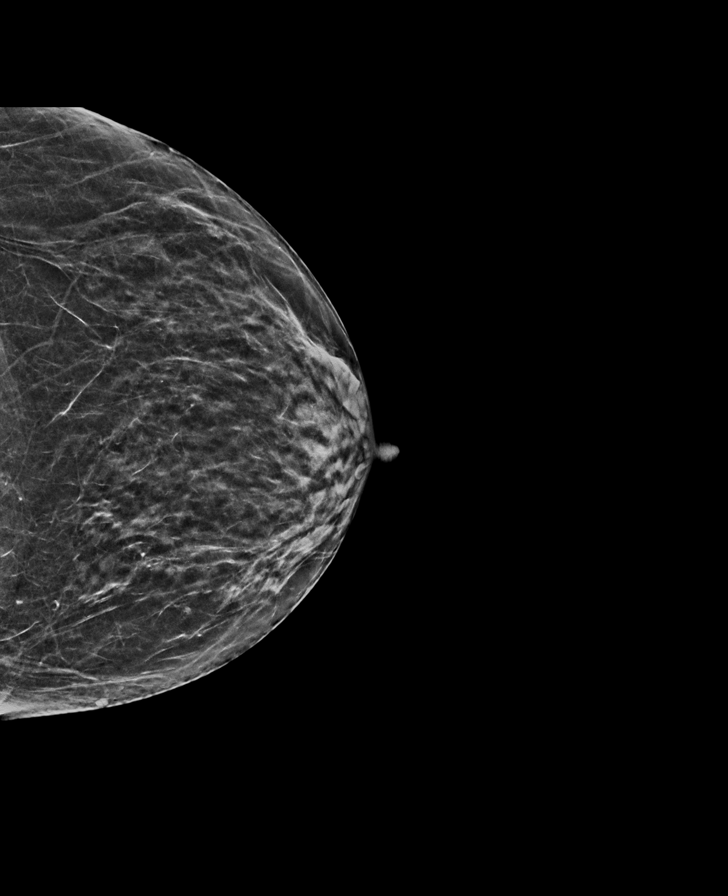

[L MLO synth-2D]
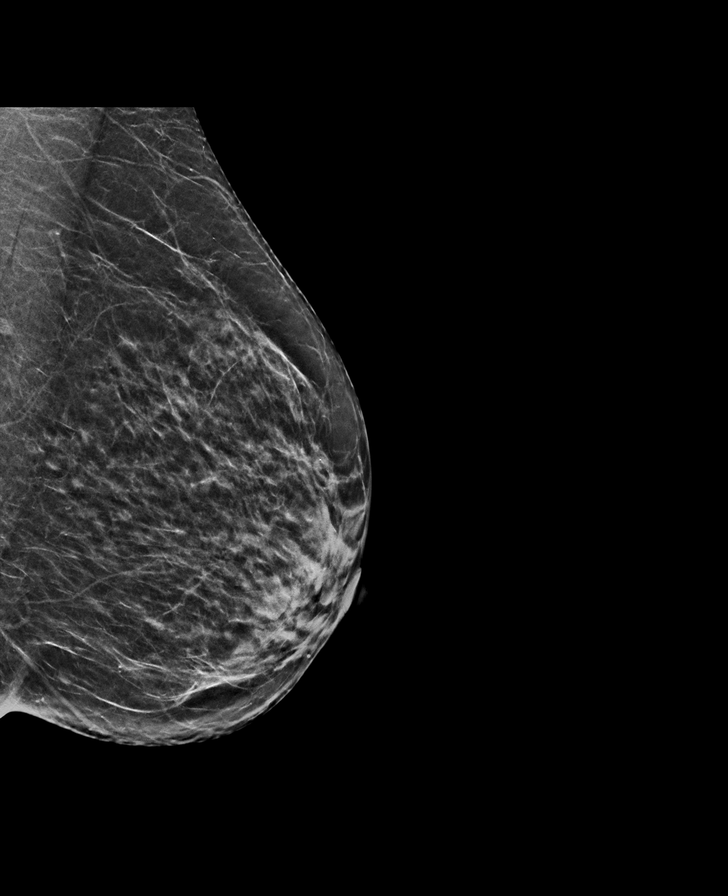

[R CC synth-2D]
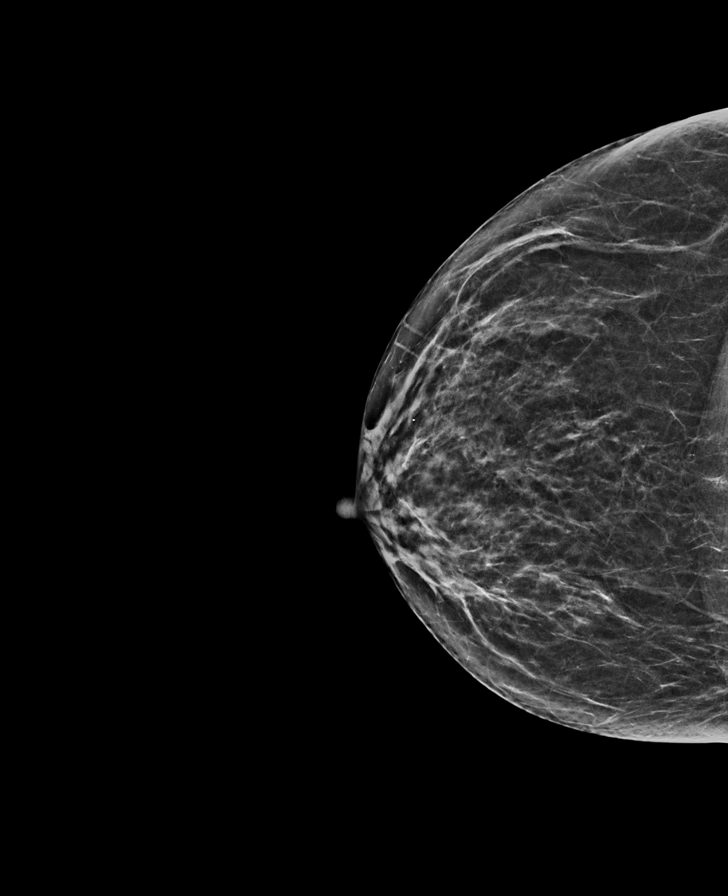

[R CC tomo · tomo slice 29/56.0]
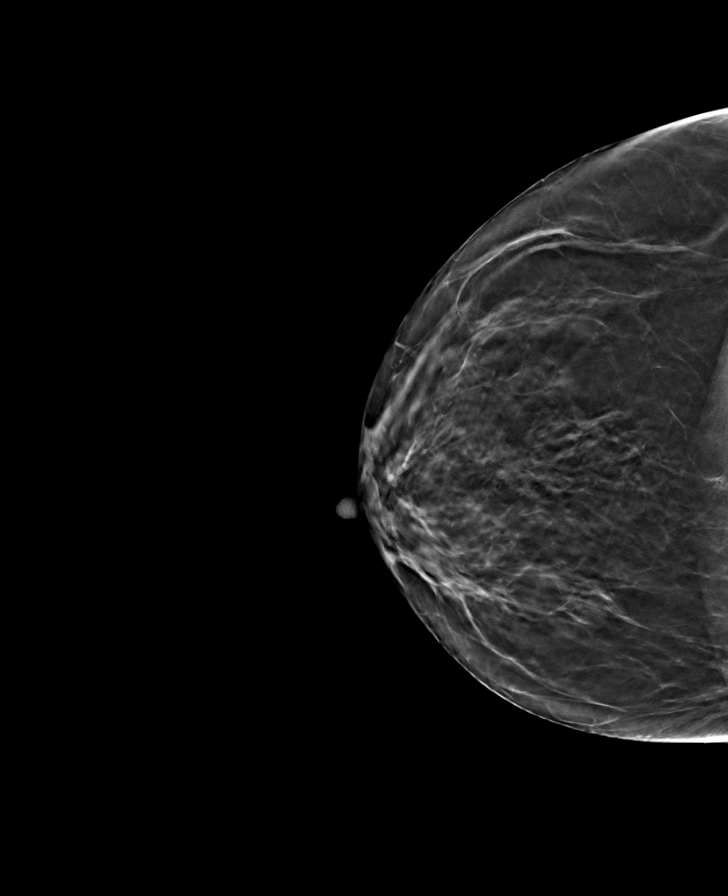

[L CC tomo · tomo slice 26/51.0]
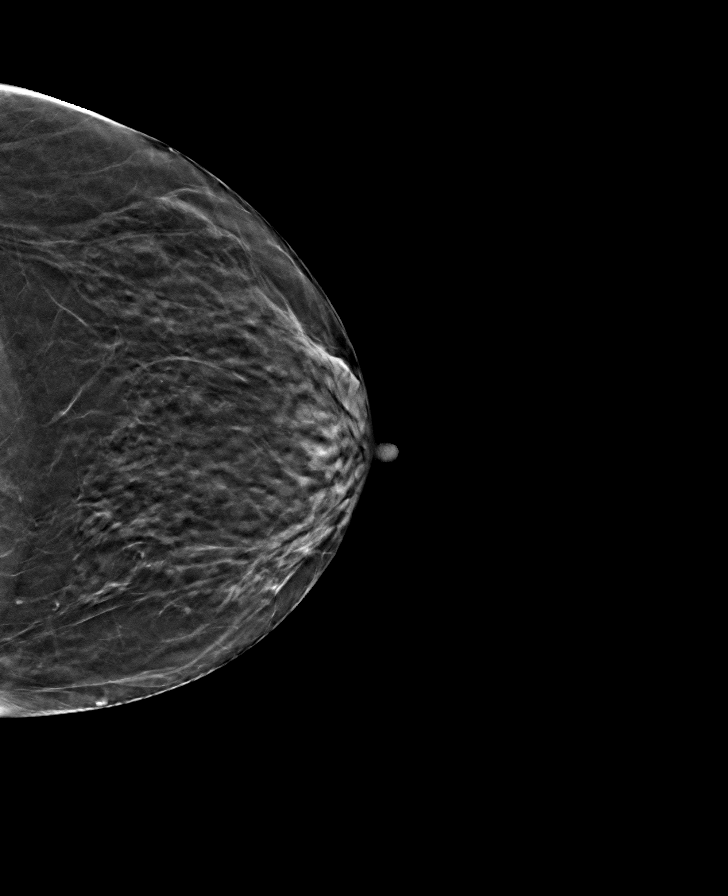

[L MLO tomo · tomo slice 27/52.0]
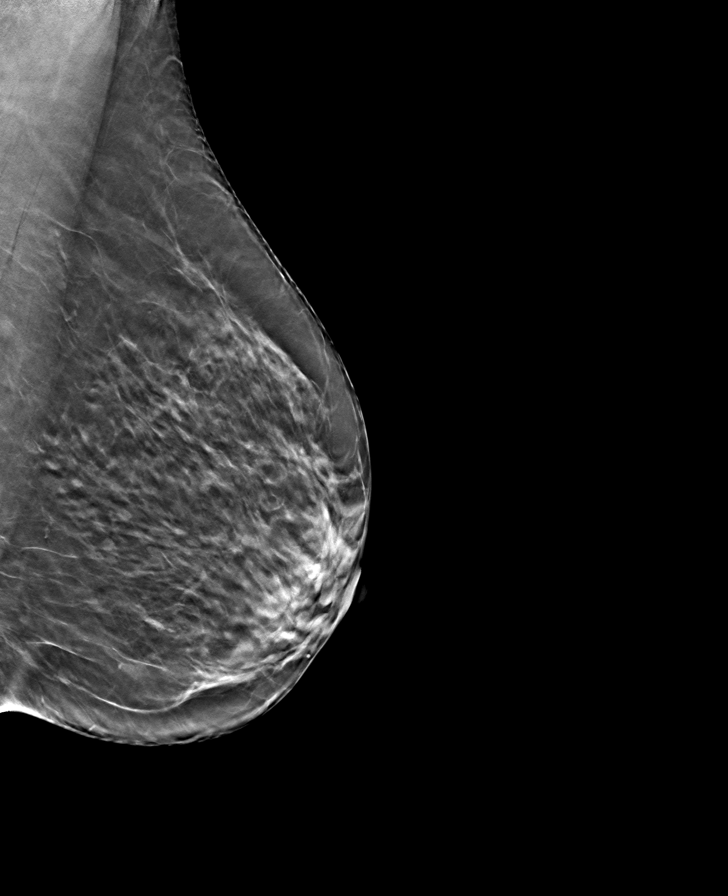

[R MLO tomo · tomo slice 29/56.0]
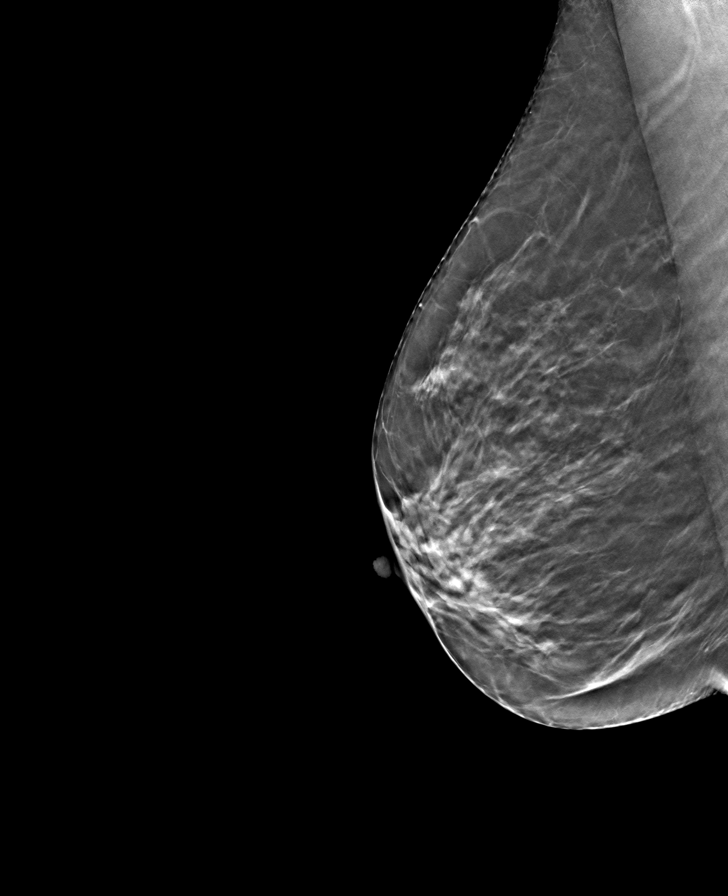

[8 of 24 positions shown; findings below may reference images not displayed]

ACR Breast Density Category b: There are scattered areas of
fibroglandular density.
FINDINGS: There are no findings suspicious for malignancy. The images were
evaluated with computer-aided detection.
IMPRESSION: No mammographic evidence of malignancy. A result letter of this
screening mammogram will be mailed directly to the patient.

RECOMMENDATION:
Screening mammogram in one year. (Code:[id number])

BI-RADS CATEGORY  1: Negative.

## 2022-01-08 NOTE — Patient Instructions (Signed)
Medication Instructions:  Your physician recommends that you continue on your current medications as directed. Please refer to the Current Medication list given to you today.  *If you need a refill on your cardiac medications before your next appointment, please call your pharmacy*   Lab Work: NONE   If you have labs (blood work) drawn today and your tests are completely normal, you will receive your results only by: MyChart Message (if you have MyChart) OR A paper copy in the mail If you have any lab test that is abnormal or we need to change your treatment, we will call you to review the results.   Testing/Procedures: Lung cancer screening CT    Follow-Up: At [facility], you and your health needs are our priority.  As part of our continuing mission to provide you with exceptional heart care, we have created designated Provider Care Teams.  These Care Teams include your primary Cardiologist (physician) and Advanced Practice Providers (APPs -  Physician Assistants and Nurse Practitioners) who all work together to provide you with the care you need, when you need it.  We recommend signing up for the patient portal called "MyChart".  Sign up information is provided on this After Visit Summary.  MyChart is used to connect with patients for Virtual Visits (Telemedicine).  Patients are able to view lab/test results, encounter notes, upcoming appointments, etc.  Non-urgent messages can be sent to your provider as well.   To learn more about what you can do with MyChart, go to [email or web address].    Your next appointment:   1 year(s)  The format for your next appointment:   In Person  Provider:   Peter Nishan, MD    Other Instructions Thank you for choosing [facility]!    Important Information About Sugar       

## 2022-01-24 ENCOUNTER — Other Ambulatory Visit (HOSPITAL_COMMUNITY): Payer: Self-pay | Admitting: Physician Assistant

## 2022-01-24 ENCOUNTER — Other Ambulatory Visit (HOSPITAL_COMMUNITY): Payer: Self-pay | Admitting: Family Medicine

## 2022-01-24 ENCOUNTER — Other Ambulatory Visit: Payer: Self-pay | Admitting: Physician Assistant

## 2022-01-24 DIAGNOSIS — M542 Cervicalgia: Secondary | ICD-10-CM

## 2022-01-24 DIAGNOSIS — M25561 Pain in right knee: Secondary | ICD-10-CM

## 2022-01-24 DIAGNOSIS — Z1231 Encounter for screening mammogram for malignant neoplasm of breast: Secondary | ICD-10-CM

## 2022-01-24 DIAGNOSIS — M25511 Pain in right shoulder: Secondary | ICD-10-CM

## 2022-02-05 ENCOUNTER — Ambulatory Visit (HOSPITAL_COMMUNITY)
Admission: RE | Admit: 2022-02-05 | Discharge: 2022-02-05 | Disposition: A | Discharge status: Home / Self Care | Payer: Medicare HMO | Source: Ambulatory Visit | Attending: Cardiovascular Disease | Admitting: Cardiovascular Disease

## 2022-02-05 ENCOUNTER — Ambulatory Visit (HOSPITAL_COMMUNITY)
Admission: RE | Admit: 2022-02-05 | Discharge: 2022-02-05 | Disposition: A | Discharge status: Home / Self Care | Payer: Medicare HMO | Source: Ambulatory Visit | Attending: Family Medicine | Admitting: Family Medicine

## 2022-02-05 DIAGNOSIS — Z1231 Encounter for screening mammogram for malignant neoplasm of breast: Secondary | ICD-10-CM

## 2022-02-05 DIAGNOSIS — I700 Atherosclerosis of aorta: Secondary | ICD-10-CM | POA: Diagnosis not present

## 2022-02-05 DIAGNOSIS — F17200 Nicotine dependence, unspecified, uncomplicated: Secondary | ICD-10-CM | POA: Insufficient documentation

## 2022-02-05 DIAGNOSIS — F17210 Nicotine dependence, cigarettes, uncomplicated: Secondary | ICD-10-CM | POA: Diagnosis not present

## 2022-02-05 DIAGNOSIS — J439 Emphysema, unspecified: Secondary | ICD-10-CM | POA: Insufficient documentation

## 2022-02-05 DIAGNOSIS — Z122 Encounter for screening for malignant neoplasm of respiratory organs: Secondary | ICD-10-CM | POA: Insufficient documentation

## 2022-02-05 DIAGNOSIS — I2510 Atherosclerotic heart disease of native coronary artery without angina pectoris: Secondary | ICD-10-CM | POA: Insufficient documentation

## 2022-02-08 ENCOUNTER — Ambulatory Visit (HOSPITAL_COMMUNITY)
Admission: RE | Admit: 2022-02-08 | Discharge: 2022-02-08 | Disposition: A | Discharge status: Home / Self Care | Payer: Medicare HMO | Source: Ambulatory Visit | Attending: Physician Assistant | Admitting: Physician Assistant

## 2022-02-08 ENCOUNTER — Ambulatory Visit (HOSPITAL_COMMUNITY): Payer: Medicare HMO

## 2022-02-08 ENCOUNTER — Encounter (HOSPITAL_COMMUNITY): Payer: Self-pay

## 2022-02-08 DIAGNOSIS — M25561 Pain in right knee: Secondary | ICD-10-CM | POA: Insufficient documentation

## 2022-02-08 DIAGNOSIS — M542 Cervicalgia: Secondary | ICD-10-CM | POA: Diagnosis present

## 2022-02-15 ENCOUNTER — Other Ambulatory Visit (HOSPITAL_COMMUNITY): Payer: Self-pay | Admitting: Physician Assistant

## 2022-02-15 DIAGNOSIS — M25511 Pain in right shoulder: Secondary | ICD-10-CM

## 2022-03-07 ENCOUNTER — Other Ambulatory Visit: Payer: Self-pay | Admitting: Neurosurgery

## 2022-03-07 ENCOUNTER — Other Ambulatory Visit (HOSPITAL_COMMUNITY): Payer: Self-pay | Admitting: Neurosurgery

## 2022-03-07 ENCOUNTER — Ambulatory Visit (HOSPITAL_COMMUNITY)
Admission: RE | Admit: 2022-03-07 | Discharge: 2022-03-07 | Disposition: A | Discharge status: Home / Self Care | Payer: Medicare HMO | Source: Ambulatory Visit | Attending: Physician Assistant | Admitting: Physician Assistant

## 2022-03-07 DIAGNOSIS — M25511 Pain in right shoulder: Secondary | ICD-10-CM | POA: Diagnosis present

## 2022-03-07 DIAGNOSIS — M4302 Spondylolysis, cervical region: Secondary | ICD-10-CM

## 2022-03-14 ENCOUNTER — Other Ambulatory Visit: Payer: Medicare HMO

## 2022-03-14 ENCOUNTER — Other Ambulatory Visit (HOSPITAL_COMMUNITY): Payer: Medicare HMO

## 2022-03-14 ENCOUNTER — Inpatient Hospital Stay (HOSPITAL_COMMUNITY): Admission: RE | Admit: 2022-03-14 | Payer: Medicare HMO | Source: Ambulatory Visit

## 2022-03-16 ENCOUNTER — Other Ambulatory Visit: Payer: Self-pay

## 2022-03-16 ENCOUNTER — Ambulatory Visit (HOSPITAL_COMMUNITY)
Admission: RE | Admit: 2022-03-16 | Discharge: 2022-03-16 | Disposition: A | Discharge status: Home / Self Care | Payer: Medicare HMO | Source: Ambulatory Visit | Attending: Neurosurgery | Admitting: Neurosurgery

## 2022-03-16 DIAGNOSIS — M2578 Osteophyte, vertebrae: Secondary | ICD-10-CM | POA: Insufficient documentation

## 2022-03-16 DIAGNOSIS — M48061 Spinal stenosis, lumbar region without neurogenic claudication: Secondary | ICD-10-CM | POA: Insufficient documentation

## 2022-03-16 DIAGNOSIS — M4302 Spondylolysis, cervical region: Secondary | ICD-10-CM | POA: Diagnosis present

## 2022-03-16 DIAGNOSIS — M4802 Spinal stenosis, cervical region: Secondary | ICD-10-CM | POA: Diagnosis not present

## 2022-03-16 DIAGNOSIS — M47816 Spondylosis without myelopathy or radiculopathy, lumbar region: Secondary | ICD-10-CM | POA: Insufficient documentation

## 2022-03-16 DIAGNOSIS — J439 Emphysema, unspecified: Secondary | ICD-10-CM | POA: Diagnosis not present

## 2022-03-16 MED ORDER — OXYCODONE HCL 5 MG PO TABS: 5.0000 mg | ORAL_TABLET | ORAL | Status: DC | PRN

## 2022-03-16 MED ORDER — LIDOCAINE HCL (PF) 1 % IJ SOLN
5.0000 mL | Freq: Once | INTRAMUSCULAR | Status: AC
  Administered 2022-03-16: 5 mL via INTRADERMAL

## 2022-03-16 MED ORDER — IOHEXOL 300 MG/ML  SOLN
10.0000 mL | Freq: Once | INTRAMUSCULAR | Status: AC | PRN
  Administered 2022-03-16: 10 mL via INTRATHECAL

## 2022-03-16 MED ORDER — ONDANSETRON HCL 4 MG/2ML IJ SOLN: 4.0000 mg | Freq: Four times a day (QID) | INTRAMUSCULAR | Status: DC | PRN

## 2022-03-16 NOTE — Progress Notes (Signed)
12:00 - Patient to bathroom. Voided. Dressed. No concerns noted.

## 2022-03-16 NOTE — Progress Notes (Signed)
Per short stay schedule patient here today for myelogram. No orders. Called Dr. Danielle Dess, per him, Dr. Sueanne Margarita patient and this nurse needed to call him. Attempted but no answer on cell number. Teodoro Kil in Radiology and informed. Per her, okay to send patient down to them with no consent or valium. Will complete once physician arrives.

## 2022-03-24 NOTE — Op Note (Signed)
03/16/2022 Cervical Myelogram  PATIENT:  Melissa Berg is a 61 y.o. female With neck and right shoulder pain PRE-OPERATIVE DIAGNOSIS:  right upper extremity pain  POST-OPERATIVE DIAGNOSIS:  same  PROCEDURE:  Lumbar Myelogram  SURGEON:  Charolette Bultman  ANESTHESIA:   local LOCAL MEDICATIONS USED:  LIDOCAINE  Procedure Note: Melissa Berg is a 61 y.o. female Was taken to the fluoroscopy suite and  positioned prone on the fluoroscopy table. His back was prepared and draped in a sterile manner. I infiltrated 8 cc into the lumbar region. I then introduced a spinal needle into the thecal sac at the L3/4 interlaminar space. I infiltrated 10cc of Isovue 300 into the thecal sac. Fluoroscopy showed the needle and contrast in the thecal sac. Melissa Berg tolerated the procedure well. she Will be taken to CT for evaluation.     PATIENT DISPOSITION:  Short Stay

## 2022-04-05 ENCOUNTER — Other Ambulatory Visit (HOSPITAL_COMMUNITY): Payer: Self-pay | Admitting: Physician Assistant

## 2022-04-05 DIAGNOSIS — M25511 Pain in right shoulder: Secondary | ICD-10-CM

## 2022-04-30 ENCOUNTER — Ambulatory Visit (HOSPITAL_COMMUNITY)
Admission: RE | Admit: 2022-04-30 | Discharge: 2022-04-30 | Disposition: A | Discharge status: Home / Self Care | Payer: Medicare HMO | Source: Ambulatory Visit | Attending: Physician Assistant | Admitting: Physician Assistant

## 2022-04-30 DIAGNOSIS — M25511 Pain in right shoulder: Secondary | ICD-10-CM | POA: Diagnosis present

## 2023-04-08 NOTE — Progress Notes (Signed)
CARDIOLOGY CONSULT NOTE       Patient ID: Melissa Berg MRN: [id number] DOB/AGE: 1960/10/06 62 y.o.  Admit date: (Not on file) Referring Physician: Leary Roca Primary Physician: Lorelei Pont, DO Primary Cardiologist: Eden Emms Reason for Consultation: CAD    HPI:  62 y.o. referred by Dr Leary Roca for CAD on 10/17/20. Previously seen by Cardiology in 2019. History of atypical chest pain , fibromyalgia, smoking Echo 03/25/2018 with EF 50-55% mild LVH. Some white coat HTN BP fine at home Myovue 05/27/18 normal no ischemia EF 55-60% She is allergic to polysorbate and cannot take beta blockers so cardiac CT not option. Lung cancer screening CT 10/03/20  showed emphysema with stable pre vascular node dating back to 2016. Commented on age advance coronary artery atherosclerosis   She does not work She is not interested in quitting smoking Smokes < 1 ppd. She has indigestion /Gerd that radiates to chest on occasion Activity limited by fibromyalgia and right knee arthritis   Myovue done 10/26/20 normal with no ischemia EF 55-60%   Having some right knee pain seen by [facility] on 11/09/20 No injections due to allergies To steroids ??  Only complaints is chronic pain from arthritis/fibromyalgia Getting shots 2x/week for polysorbate Allergy   Daughter lives with her and be of some help  ROS All other systems reviewed and negative except as noted above  Past Medical History:  Diagnosis Date   Back pain    Collagen vascular disease (HCC)    DJD (degenerative joint disease)    Fibromyalgia    Hypoalbuminemia 09/24/2011   Osteoporosis    Rheumatoid arthritis(714.0)    Vitamin B12 deficiency 09/24/2011    Family History  Problem Relation Age of Onset   Arthritis Mother    Arthritis Father     Social History   Socioeconomic History   Marital status: Single    Spouse name: Not on file   Number of children: Not on file   Years of education: Not on file   Highest education level: Not on  file  Occupational History   Not on file  Tobacco Use   Smoking status: Every Day    Current packs/day: 0.25    Types: Cigarettes   Smokeless tobacco: Never  Vaping Use   Vaping status: Never Used  Substance and Sexual Activity   Alcohol use: No   Drug use: No   Sexual activity: Yes    Birth control/protection: Surgical  Other Topics Concern   Not on file  Social History Narrative   Not on file   Social Determinants of Health   Financial Resource Strain: Not on file  Food Insecurity: Not on file  Transportation Needs: Not on file  Physical Activity: Not on file  Stress: Not on file  Social Connections: Not on file  Intimate Partner Violence: Not on file    Past Surgical History:  Procedure Laterality Date   ABDOMINAL HYSTERECTOMY        Current Outpatient Medications:    cetirizine HCl (ZYRTEC) 5 MG/5ML SOLN, Take 1 dose as needed for itching, Disp: , Rfl:    predniSONE (DELTASONE) 20 MG tablet, Take 20 mg by mouth 2 (two) times daily., Disp: , Rfl:    Vitamin D, Ergocalciferol, (DRISDOL) 1.25 MG (50000 UNIT) CAPS capsule, Take 50,000 Units by mouth every 7 (seven) days., Disp: , Rfl:     Physical Exam: Blood pressure (!) 140/90, pulse (!) 113, height 5\' 3"  (1.6 m), weight 140 lb 3.2 oz (63.6  kg), SpO2 97%.   Affect appropriate Healthy:  appears stated age HEENT: normal Neck supple with no adenopathy JVP normal no bruits no thyromegaly Lungs clear with no wheezing and good diaphragmatic motion Heart:  S1/S2 no murmur, no rub, gallop or click PMI normal Abdomen: benighn, BS positve, no tenderness, no AAA no bruit.  No HSM or HJR Distal pulses intact with no bruits No edema Neuro non-focal Skin warm and dry No muscular weakness   Labs:   Lab Results  Component Value Date   WBC 12.0 (H) 11/30/2019   HGB 15.1 (H) 11/30/2019   HCT 46.2 (H) 11/30/2019   MCV 96.3 11/30/2019   PLT 149 (L) 11/30/2019   No results for input(s): "NA", "K", "CL", "CO2",  "BUN", "CREATININE", "CALCIUM", "PROT", "BILITOT", "ALKPHOS", "ALT", "AST", "GLUCOSE" in the last 168 hours.  Invalid input(s): "LABALBU" Lab Results  Component Value Date   CKTOTAL 123 11/30/2019   TROPONINI <0.03 09/25/2018   No results found for: "CHOL" No results found for: "HDL" No results found for: "LDLCALC" No results found for: "TRIG" No results found for: "CHOLHDL" No results found for: "LDLDIRECT"    Radiology: No results found.   EKG: 12/01/19 ST rate 101 nonspecific ST changes 04/12/2023 NSR rate 62 normal    ASSESSMENT AND PLAN:   1. CAD: in smoker subclinical seen on chest CT for lung cancer screening Normal myovue 05/27/18 and 10/26/20 observe  2. Fibromyalgia:  Allergic reaction to oral meds f/u primary consider infusions  3. Emphysema/COPD:  Counseled on smoking cessation < 10 minutes  F/u lung cancer screening CT due no cancer on scan 01/2022  4. BP elevated today she says its fine at home and elevated because she's in pain Does not want to be on any BP meds   Lung cancer CT  F/U in a year  Signed: Charlton Haws 04/12/2023, 8:57 AM

## 2023-04-12 ENCOUNTER — Ambulatory Visit: Payer: Medicare HMO | Attending: Cardiovascular Disease | Admitting: Cardiovascular Disease

## 2023-04-12 ENCOUNTER — Encounter: Payer: Self-pay | Admitting: Cardiovascular Disease

## 2023-04-12 VITALS — BP 140/90 | HR 113 | Ht 63.0 in | Wt 140.2 lb

## 2023-04-12 DIAGNOSIS — F17200 Nicotine dependence, unspecified, uncomplicated: Secondary | ICD-10-CM | POA: Diagnosis not present

## 2023-04-12 DIAGNOSIS — I10 Essential (primary) hypertension: Secondary | ICD-10-CM

## 2023-04-12 DIAGNOSIS — I2510 Atherosclerotic heart disease of native coronary artery without angina pectoris: Secondary | ICD-10-CM

## 2023-04-12 NOTE — Patient Instructions (Signed)
Medication Instructions:  Your physician recommends that you continue on your current medications as directed. Please refer to the Current Medication list given to you today.  *If you need a refill on your cardiac medications before your next appointment, please call your pharmacy*   Lab Work: NONE   If you have labs (blood work) drawn today and your tests are completely normal, you will receive your results only by: MyChart Message (if you have MyChart) OR A paper copy in the mail If you have any lab test that is abnormal or we need to change your treatment, we will call you to review the results.   Testing/Procedures: Lung Cancer Screening CT    Follow-Up: At [facility], you and your health needs are our priority.  As part of our continuing mission to provide you with exceptional heart care, we have created designated Provider Care Teams.  These Care Teams include your primary Cardiologist (physician) and Advanced Practice Providers (APPs -  Physician Assistants and Nurse Practitioners) who all work together to provide you with the care you need, when you need it.  We recommend signing up for the patient portal called "MyChart".  Sign up information is provided on this After Visit Summary.  MyChart is used to connect with patients for Virtual Visits (Telemedicine).  Patients are able to view lab/test results, encounter notes, upcoming appointments, etc.  Non-urgent messages can be sent to your provider as well.   To learn more about what you can do with MyChart, go to [email or web address].    Your next appointment:   1 year(s)  Provider:   You may see Charlton Haws, MD or one of the following Advanced Practice Providers on your designated Care Team:   Randall An, PA-C  Jacolyn Reedy, PA-C     Other Instructions Thank you for choosing [facility]!

## 2023-04-20 ENCOUNTER — Ambulatory Visit (HOSPITAL_COMMUNITY)
Admission: RE | Admit: 2023-04-20 | Discharge: 2023-04-20 | Disposition: A | Discharge status: Home / Self Care | Payer: Medicare HMO | Source: Ambulatory Visit | Attending: Cardiovascular Disease | Admitting: Cardiovascular Disease

## 2023-04-20 DIAGNOSIS — Z122 Encounter for screening for malignant neoplasm of respiratory organs: Secondary | ICD-10-CM | POA: Diagnosis present

## 2023-04-20 DIAGNOSIS — F17210 Nicotine dependence, cigarettes, uncomplicated: Secondary | ICD-10-CM | POA: Insufficient documentation

## 2023-04-20 DIAGNOSIS — F17200 Nicotine dependence, unspecified, uncomplicated: Secondary | ICD-10-CM

## 2024-03-27 NOTE — Progress Notes (Signed)
 CARDIOLOGY CONSULT NOTE       Patient ID: Melissa Berg MRN: [id number] DOB/AGE: Mar 28, 1961 63 y.o.  Referring Physician: Henriette Primary Physician: Melissa Berg Anes, DO Primary Cardiologist: Melissa Berg Reason for Consultation: CAD   HPI:  63 y.o. referred by Dr Melissa Berg for CAD on 10/17/20. Previously seen by Cardiology in 2019. History of atypical chest pain , fibromyalgia, smoking Echo 03/25/2018 with EF 50-55% mild LVH. Some white coat HTN BP fine at home Myovue 05/27/18 normal no ischemia EF 55-60% She is allergic to polysorbate and cannot take beta blockers so cardiac CT not option. Lung cancer screening CT 10/03/20  showed emphysema with stable pre vascular node dating back to 2016. Commented on age advance coronary artery atherosclerosis   She does not work She is not interested in quitting smoking Smokes < 1 ppd. She has indigestion /Gerd that radiates to chest on occasion Activity limited by fibromyalgia and right knee arthritis   Myovue done 10/26/20 normal with no ischemia EF 55-60%   Having some right knee pain seen by [facility] on 11/09/20 No injections due to allergies To steroids ??  Only complaints is chronic pain from arthritis/fibromyalgia Getting shots 2x/week for polysorbate Allergy   Daughter not living with her anymore Melissa Berg is pretty independent    ROS All other systems reviewed and negative except as noted above  Past Medical History:  Diagnosis Date   Back pain    Collagen vascular disease    DJD (degenerative joint disease)    Fibromyalgia    Hypoalbuminemia 09/24/2011   Osteoporosis    Rheumatoid arthritis(714.0)    Vitamin B12 deficiency 09/24/2011    Family History  Problem Relation Age of Onset   Arthritis Mother    Arthritis Father     Social History   Socioeconomic History   Marital status: Single    Spouse name: Not on file   Number of children: Not on file   Years of education: Not on file   Highest education level: Not on file   Occupational History   Not on file  Tobacco Use   Smoking status: Every Day    Current packs/day: 0.25    Types: Cigarettes   Smokeless tobacco: Never  Vaping Use   Vaping status: Never Used  Substance and Sexual Activity   Alcohol use: No   Drug use: No   Sexual activity: Yes    Birth control/protection: Surgical  Other Topics Concern   Not on file  Social History Narrative   Not on file   Social Drivers of Health   Financial Resource Strain: Not on file  Food Insecurity: Not on file  Transportation Needs: Not on file  Physical Activity: Not on file  Stress: Not on file  Social Connections: Not on file  Intimate Partner Violence: Not on file    Past Surgical History:  Procedure Laterality Date   ABDOMINAL HYSTERECTOMY        Current Outpatient Medications:    cetirizine HCl (ZYRTEC) 5 MG/5ML SOLN, Take 1 dose as needed for itching, Disp: , Rfl:    predniSONE  (DELTASONE ) 20 MG tablet, Take 20 mg by mouth 2 (two) times daily., Disp: , Rfl:    Vitamin D, Ergocalciferol, (DRISDOL) 1.25 MG (50000 UNIT) CAPS capsule, Take 50,000 Units by mouth every 7 (seven) days., Disp: , Rfl:     Physical Exam: Blood pressure 132/84, pulse 82, height 5' 3 (1.6 m), weight 145 lb 6.4 oz (66 kg), SpO2 98%.   Affect  appropriate Healthy:  appears stated age HEENT: normal Neck supple with no adenopathy JVP normal no bruits no thyromegaly Lungs clear with no wheezing and good diaphragmatic motion Heart:  S1/S2 no murmur, no rub, gallop or click PMI normal Abdomen: benighn, BS positve, no tenderness, no AAA no bruit.  No HSM or HJR Distal pulses intact with no bruits No edema Neuro non-focal Skin warm and dry No muscular weakness   Labs:   Lab Results  Component Value Date   WBC 12.0 (H) 11/30/2019   HGB 15.1 (H) 11/30/2019   HCT 46.2 (H) 11/30/2019   MCV 96.3 11/30/2019   PLT 149 (L) 11/30/2019   No results for input(s): NA, K, CL, CO2, BUN, CREATININE,  CALCIUM, PROT, BILITOT, ALKPHOS, ALT, AST, GLUCOSE in the last 168 hours.  Invalid input(s): LABALBU Lab Results  Component Value Date   CKTOTAL 123 11/30/2019   TROPONINI <0.03 09/25/2018   No results found for: CHOL No results found for: HDL No results found for: LDLCALC No results found for: TRIG No results found for: CHOLHDL No results found for: LDLDIRECT    Radiology: No results found.   EKG: 12/01/19 ST rate 101 nonspecific ST changes 04/10/2024 NSR rate 62 normal    ASSESSMENT AND PLAN:   1. CAD: in smoker subclinical seen on chest CT for lung cancer screening Normal myovue 05/27/18 and 10/26/20 observe  2. Fibromyalgia:  Allergic reaction to oral meds f/u primary consider infusions  3. Emphysema/COPD:  Counseled on smoking cessation < 10 minutes  Lung cancer CT negative 05/02/23 update  4. BP ok today  she says its fine at home  Does not want to be on any BP meds   Lung cancer CT  F/U in a year  Signed: Maude Berg 04/10/2024, 9:28 AM

## 2024-04-10 ENCOUNTER — Ambulatory Visit: Attending: Cardiovascular Disease | Admitting: Cardiovascular Disease

## 2024-04-10 ENCOUNTER — Encounter: Payer: Self-pay | Admitting: Cardiovascular Disease

## 2024-04-10 VITALS — BP 132/84 | HR 82 | Ht 63.0 in | Wt 145.4 lb

## 2024-04-10 DIAGNOSIS — I10 Essential (primary) hypertension: Secondary | ICD-10-CM | POA: Diagnosis not present

## 2024-04-10 DIAGNOSIS — F17200 Nicotine dependence, unspecified, uncomplicated: Secondary | ICD-10-CM

## 2024-04-10 DIAGNOSIS — I2510 Atherosclerotic heart disease of native coronary artery without angina pectoris: Secondary | ICD-10-CM | POA: Diagnosis not present

## 2024-04-10 NOTE — Patient Instructions (Signed)
 Medication Instructions:  Your physician recommends that you continue on your current medications as directed. Please refer to the Current Medication list given to you today.   Labwork: None today  Testing/Procedures: Chest CT for lung cancer screen  Follow-Up: 1 year  Any Other Special Instructions Will Be Listed Below (If Applicable).  If you need a refill on your cardiac medications before your next appointment, please call your pharmacy.

## 2024-04-29 ENCOUNTER — Ambulatory Visit (HOSPITAL_COMMUNITY)
Admission: RE | Admit: 2024-04-29 | Discharge: 2024-04-29 | Disposition: A | Discharge status: Home / Self Care | Source: Ambulatory Visit | Attending: Cardiovascular Disease | Admitting: Cardiovascular Disease

## 2024-04-29 DIAGNOSIS — R9389 Abnormal findings on diagnostic imaging of other specified body structures: Secondary | ICD-10-CM | POA: Diagnosis not present

## 2024-04-29 DIAGNOSIS — Z122 Encounter for screening for malignant neoplasm of respiratory organs: Secondary | ICD-10-CM | POA: Insufficient documentation

## 2024-04-29 DIAGNOSIS — J439 Emphysema, unspecified: Secondary | ICD-10-CM | POA: Diagnosis not present

## 2024-04-29 DIAGNOSIS — I2510 Atherosclerotic heart disease of native coronary artery without angina pectoris: Secondary | ICD-10-CM | POA: Diagnosis present

## 2024-04-29 DIAGNOSIS — N200 Calculus of kidney: Secondary | ICD-10-CM | POA: Insufficient documentation

## 2024-04-29 DIAGNOSIS — F17210 Nicotine dependence, cigarettes, uncomplicated: Secondary | ICD-10-CM | POA: Diagnosis not present

## 2024-04-29 DIAGNOSIS — I700 Atherosclerosis of aorta: Secondary | ICD-10-CM | POA: Diagnosis not present

## 2024-05-08 ENCOUNTER — Ambulatory Visit: Payer: Self-pay | Admitting: Cardiovascular Disease

## 2024-07-28 ENCOUNTER — Emergency Department (HOSPITAL_COMMUNITY)
Admission: EM | Admit: 2024-07-28 | Discharge: 2024-07-28 | Disposition: A | Discharge status: Home / Self Care | Attending: Emergency Medicine | Admitting: Emergency Medicine

## 2024-07-28 ENCOUNTER — Encounter (HOSPITAL_COMMUNITY): Payer: Self-pay | Admitting: Emergency Medicine

## 2024-07-28 ENCOUNTER — Emergency Department (HOSPITAL_COMMUNITY)

## 2024-07-28 DIAGNOSIS — R051 Acute cough: Secondary | ICD-10-CM | POA: Diagnosis present

## 2024-07-28 DIAGNOSIS — R062 Wheezing: Secondary | ICD-10-CM | POA: Insufficient documentation

## 2024-07-28 DIAGNOSIS — Z720 Tobacco use: Secondary | ICD-10-CM | POA: Diagnosis not present

## 2024-07-28 MED ORDER — ALBUTEROL SULFATE HFA 108 (90 BASE) MCG/ACT IN AERS
2.0000 | INHALATION_SPRAY | Freq: Once | RESPIRATORY_TRACT | Status: AC
  Administered 2024-07-28: 2 via RESPIRATORY_TRACT
  Filled 2024-07-28: qty 6.7

## 2024-07-28 NOTE — ED Provider Notes (Signed)
 " [place] EMERGENCY DEPARTMENT AT [facility] Provider Note   CSN: [id number] Arrival date & time: 07/28/24  9173     Patient presents with: Cough   Melissa Berg is a 64 y.o. female.   HPI 64 year old female presents with cough.  She states that she was around someone who had the flu and she has been having a cough for about 1 week.  It is a dry cough.  She denies fever, chest pain, shortness of breath, leg swelling.  She states her throat feels dry.  She has not tried any interventions for this.  She denies a history of asthma or COPD/emphysema.  She does smoke, about 1 pack every 3 days.  Prior to Admission medications  Medication Sig Start Date End Date Taking? Authorizing Provider  cetirizine HCl (ZYRTEC) 5 MG/5ML SOLN Take 1 dose as needed for itching    [provider]  predniSONE  (DELTASONE ) 20 MG tablet Take 20 mg by mouth 2 (two) times daily. 03/15/23   [provider]  Vitamin D, Ergocalciferol, (DRISDOL) 1.25 MG (50000 UNIT) CAPS capsule Take 50,000 Units by mouth every 7 (seven) days.    [provider]    Allergies: Methylprednisolone sodium succinate, Other, and Polysorbate    Review of Systems  Constitutional:  Negative for fever.  Respiratory:  Positive for cough. Negative for shortness of breath.   Cardiovascular:  Negative for chest pain and leg swelling.    Updated Vital Signs BP (!) 147/95   Pulse 76   Temp 98 F (36.7 C) (Oral)   Resp 18   SpO2 90%   Physical Exam Vitals and nursing note reviewed.  Constitutional:      General: She is not in acute distress.    Appearance: She is well-developed. She is not ill-appearing or diaphoretic.  HENT:     Head: Normocephalic and atraumatic.  Cardiovascular:     Rate and Rhythm: Normal rate and regular rhythm.     Heart sounds: Normal heart sounds.  Pulmonary:     Effort: Pulmonary effort is normal.     Breath sounds: Wheezing (mild, diffuse) present.  Abdominal:      General: There is no distension.     Palpations: Abdomen is soft.  Musculoskeletal:     Right lower leg: No edema.     Left lower leg: No edema.  Skin:    General: Skin is warm and dry.  Neurological:     Mental Status: She is alert.     (all labs ordered are listed, but only abnormal results are displayed) Labs Reviewed - No data to display  EKG: None  Radiology: DG Chest 2 View Result Date: 07/28/2024 CLINICAL DATA:  Cough 1 week. EXAM: CHEST - 2 VIEW COMPARISON:  11/30/2019 FINDINGS: Lungs are adequately inflated and otherwise clear. Cardiomediastinal silhouette and remainder of the exam is unchanged. IMPRESSION: No active cardiopulmonary disease. Electronically Signed   By: Toribio Agreste M.D.   On: 07/28/2024 09:19     Procedures   Medications Ordered in the ED  albuterol  (VENTOLIN  HFA) 108 (90 Base) MCG/ACT inhaler 2 puff (2 puffs Inhalation Given 07/28/24 0856)                                    Medical Decision Making Amount and/or Complexity of Data Reviewed Radiology: ordered and independent interpretation performed.    Details: No pneumonia  Risk  Prescription drug management.   Patient reports that she has had no other symptoms other than the cough.  While it is possible this was from the flu, her symptoms are ongoing for over a week, I do not think a flu/COVID test will change management.  She would not have indication for treatment.  She does not have any chest pain or shortness of breath.  Her x-ray is unremarkable.  She does have some mild wheezing and was given albuterol  inhaler.  She endorses a history of allergies to polysorbate.  Given I am not sure which cough medicines may or may not have this in it, I have advised her to talk to her pharmacist for what cough medicine would be okay.  Otherwise, I do not think antibiotics are warranted.  I stressed the importance of stopping smoking.  Otherwise appears to be a viral process and/or some underlying lung  disease.  However no known history of asthma or COPD, I do not think steroids are indicated at this time.  Will have her follow-up with PCP.  Given return precautions.     Final diagnoses:  Acute cough  Tobacco abuse    ED Discharge Orders     None          Freddi Hamilton, MD 07/28/24 1103  "

## 2024-07-28 NOTE — Discharge Instructions (Signed)
 It is important to stop smoking.  You may use the albuterol  inhaler provided 1-2 puffs every 4 or 6 hours as needed for cough, wheezing, shortness of breath.  Talk to your pharmacist about what cough medicine you are allowed to take due to your polysorbate allergy.  Return to the ER for any new or worsening symptoms.

## 2024-07-28 NOTE — ED Triage Notes (Signed)
 Pt c/o a dry cough x 1 week, states she was around a bunch of people who were coughing and then I started coughing and I can't get rid of it denies any shob at this time, 94% RA, denies fevers, denies CP,
